# Patient Record
Sex: Female | Born: 1985 | Race: Black or African American | Hispanic: No | Marital: Single | Smoking: Current every day smoker
Health system: Southern US, Community
[De-identification: ages and names within clinical notes are randomized; demographics above are authoritative.]

---

## 2003-06-13 HISTORY — PX: DILATION AND CURETTAGE OF UTERUS: SHX78

## 2013-09-06 ENCOUNTER — Encounter (HOSPITAL_COMMUNITY): Payer: Self-pay | Admitting: Emergency Medicine

## 2013-09-06 ENCOUNTER — Emergency Department (HOSPITAL_COMMUNITY)
Admission: EM | Admit: 2013-09-06 | Discharge: 2013-09-06 | Disposition: A | Discharge status: Home / Self Care | Payer: Medicaid Other | Attending: Emergency Medicine | Admitting: Emergency Medicine

## 2013-09-06 DIAGNOSIS — Z8619 Personal history of other infectious and parasitic diseases: Secondary | ICD-10-CM | POA: Insufficient documentation

## 2013-09-06 DIAGNOSIS — R599 Enlarged lymph nodes, unspecified: Secondary | ICD-10-CM | POA: Insufficient documentation

## 2013-09-06 DIAGNOSIS — R220 Localized swelling, mass and lump, head: Secondary | ICD-10-CM | POA: Insufficient documentation

## 2013-09-06 DIAGNOSIS — L089 Local infection of the skin and subcutaneous tissue, unspecified: Secondary | ICD-10-CM | POA: Insufficient documentation

## 2013-09-06 DIAGNOSIS — R221 Localized swelling, mass and lump, neck: Secondary | ICD-10-CM

## 2013-09-06 MED ORDER — HYDROCODONE-ACETAMINOPHEN 5-325 MG PO TABS: 1.0000 | ORAL_TABLET | ORAL | Status: DC | PRN

## 2013-09-06 MED ORDER — VALACYCLOVIR HCL 1 G PO TABS: 1000.0000 mg | ORAL_TABLET | Freq: Three times a day (TID) | ORAL | Status: DC

## 2013-09-06 MED ORDER — FLUORESCEIN SODIUM 1 MG OP STRP
1.0000 | ORAL_STRIP | Freq: Once | OPHTHALMIC | Status: AC
  Administered 2013-09-06: 14:00:00 via OPHTHALMIC
  Filled 2013-09-06: qty 1

## 2013-09-06 MED ORDER — TETRACAINE HCL 0.5 % OP SOLN
1.0000 [drp] | Freq: Once | OPHTHALMIC | Status: AC
  Administered 2013-09-06: 1 [drp] via OPHTHALMIC
  Filled 2013-09-06: qty 2

## 2013-09-06 MED ORDER — ACYCLOVIR 400 MG PO TABS: 800.0000 mg | ORAL_TABLET | Freq: Every day | ORAL | Status: DC

## 2013-09-06 MED ORDER — SULFAMETHOXAZOLE-TRIMETHOPRIM 800-160 MG PO TABS: 1.0000 | ORAL_TABLET | Freq: Two times a day (BID) | ORAL | Status: DC

## 2013-09-06 NOTE — Discharge Instructions (Signed)
Impetigo Impetigo is an infection of the skin, most common in babies and children.  CAUSES  It is caused by staphylococcal or streptococcal germs (bacteria). Impetigo can start after any damage to the skin. The damage to the skin may be from things like:   Chickenpox.  Scrapes.  Scratches.  Insect bites (common when children scratch the bite).  Cuts.  Nail biting or chewing. Impetigo is contagious. It can be spread from one person to another. Avoid close skin contact, or sharing towels or clothing. SYMPTOMS  Impetigo usually starts out as small blisters or pustules. Then they turn into tiny yellow-crusted sores (lesions).  There may also be:  Large blisters.  Itching or pain.  Pus.  Swollen lymph glands. With scratching, irritation, or non-treatment, these small areas may get larger. Scratching can cause the germs to get under the fingernails; then scratching another part of the skin can cause the infection to be spread there. DIAGNOSIS  Diagnosis of impetigo is usually made by a physical exam. A skin culture (test to grow bacteria) may be done to prove the diagnosis or to help decide the best treatment.  TREATMENT  Mild impetigo can be treated with prescription antibiotic cream. Oral antibiotic medicine may be used in more severe cases. Medicines for itching may be used. HOME CARE INSTRUCTIONS   To avoid spreading impetigo to other body areas:  Keep fingernails short and clean.  Avoid scratching.  Cover infected areas if necessary to keep from scratching.  Gently wash the infected areas with antibiotic soap and water.  Soak crusted areas in warm soapy water using antibiotic soap.  Gently rub the areas to remove crusts. Do not scrub.  Wash hands often to avoid spread this infection.  Keep children with impetigo home from school or daycare until they have used an antibiotic cream for 48 hours (2 days) or oral antibiotic medicine for 24 hours (1 day), and their skin  shows significant improvement.  Children may attend school or daycare if they only have a few sores and if the sores can be covered by a bandage or clothing. SEEK MEDICAL CARE IF:   More blisters or sores show up despite treatment.  Other family members get sores.  Rash is not improving after 48 hours (2 days) of treatment. SEEK IMMEDIATE MEDICAL CARE IF:   You see spreading redness or swelling of the skin around the sores.  You see red streaks coming from the sores.  Your child develops a fever of 100.4 F (37.2 C) or higher.  Your child develops a sore throat.  Your child is acting ill (lethargic, sick to their stomach). Document Released: 05/26/2000 Document Revised: 08/21/2011 Document Reviewed: 03/25/2008 [facility] Patient Information 2014 [place], [place].  Shingles Shingles (herpes zoster) is an infection that is caused by the same virus that causes chickenpox (varicella). The infection causes a painful skin rash and fluid-filled blisters, which eventually break open, crust over, and heal. It may occur in any area of the body, but it usually affects only one side of the body or face. The pain of shingles usually lasts about 1 month. However, some people with shingles may develop long-term (chronic) pain in the affected area of the body. Shingles often occurs many years after the person had chickenpox. It is more common:  In people older than 50 years.  In people with weakened immune systems, such as those with HIV, AIDS, or cancer.  In people taking medicines that weaken the immune system, such as transplant  medicines.  In people under great stress. CAUSES  Shingles is caused by the varicella zoster virus (VZV), which also causes chickenpox. After a person is infected with the virus, it can remain in the person's body for years in an inactive state (dormant). To cause shingles, the virus reactivates and breaks out as an infection in a nerve root. The virus can be spread  from person to person (contagious) through contact with open blisters of the shingles rash. It will only spread to people who have not had chickenpox. When these people are exposed to the virus, they may develop chickenpox. They will not develop shingles. Once the blisters scab over, the person is no longer contagious and cannot spread the virus to others. SYMPTOMS  Shingles shows up in stages. The initial symptoms may be pain, itching, and tingling in an area of the skin. This pain is usually described as burning, stabbing, or throbbing.In a few days or weeks, a painful red rash will appear in the area where the pain, itching, and tingling were felt. The rash is usually on one side of the body in a band or belt-like pattern. Then, the rash usually turns into fluid-filled blisters. They will scab over and dry up in approximately 2 3 weeks. Flu-like symptoms may also occur with the initial symptoms, the rash, or the blisters. These may include:  Fever.  Chills.  Headache.  Upset stomach. DIAGNOSIS  Your caregiver will perform a skin exam to diagnose shingles. Skin scrapings or fluid samples may also be taken from the blisters. This sample will be examined under a microscope or sent to a lab for further testing. TREATMENT  There is no specific cure for shingles. Your caregiver will likely prescribe medicines to help you manage the pain, recover faster, and avoid long-term problems. This may include antiviral drugs, anti-inflammatory drugs, and pain medicines. HOME CARE INSTRUCTIONS   Take a cool bath or apply cool compresses to the area of the rash or blisters as directed. This may help with the pain and itching.   Only take over-the-counter or prescription medicines as directed by your caregiver.   Rest as directed by your caregiver.  Keep your rash and blisters clean with mild soap and cool water or as directed by your caregiver.  Do not pick your blisters or scratch your rash. Apply an  anti-itch cream or numbing creams to the affected area as directed by your caregiver.  Keep your shingles rash covered with a loose bandage (dressing).  Avoid skin contact with:  Babies.   Pregnant women.   Children with eczema.   Elderly people with transplants.   People with chronic illnesses, such as leukemia or AIDS.   Wear loose-fitting clothing to help ease the pain of material rubbing against the rash.  Keep all follow-up appointments with your caregiver.If the area involved is on your face, you may receive a referral for follow-up to a specialist, such as an eye doctor (ophthalmologist) or an ear, nose, and throat (ENT) doctor. Keeping all follow-up appointments will help you avoid eye complications, chronic pain, or disability.  SEEK IMMEDIATE MEDICAL CARE IF:   You have facial pain, pain around the eye area, or loss of feeling on one side of your face.  You have ear pain or ringing in your ear.  You have loss of taste.  Your pain is not relieved with prescribed medicines.   Your redness or swelling spreads.   You have more pain and swelling.  Your condition  is worsening or has changed.   You have a feveror persistent symptoms for more than 2 3 days.  You have a fever and your symptoms suddenly get worse. MAKE SURE YOU:  Understand these instructions.  Will watch your condition.  Will get help right away if you are not doing well or get worse. Document Released: 05/29/2005 Document Revised: 02/21/2012 Document Reviewed: 01/11/2012 [facility] Patient Information 2014 [place], [place]. Acetaminophen; Hydrocodone tablets or capsules What is this medicine? ACETAMINOPHEN; HYDROCODONE (a set a MEE noe fen; hye droe KOE done) is a pain reliever. It is used to treat mild to moderate pain. This medicine may be used for other purposes; ask your health care provider or pharmacist if you have questions. COMMON BRAND NAME(S): Anexsia, Bancap HC , Ceta-Plus,  Co-Gesic, Comfortpak , Dolagesic, [employer], [address]/[employer] , [address] , Hydrogesic, Lorcet HD, Lorcet Plus, Lorcet, Sleepy Eye, Margesic H, Maxidone, Mc[place], Polygesic, Winner, Central, Vicodin ES, Vicodin HP, Vicodin, [place] Baseman What should I tell my health care provider before I take this medicine? They need to know if you have any of these conditions: -brain tumor -Crohn's disease, inflammatory bowel disease, or ulcerative colitis -drug abuse or addiction -head injury -heart or circulation problems -if you often drink alcohol -kidney disease or problems going to the bathroom -liver disease -lung disease, asthma, or breathing problems -an unusual or allergic reaction to acetaminophen, hydrocodone, other opioid analgesics, other medicines, foods, dyes, or preservatives -pregnant or trying to get pregnant -breast-feeding How should I use this medicine? Take this medicine by mouth. Swallow it with a full glass of water. Follow the directions on the prescription label. If the medicine upsets your stomach, take the medicine with food or milk. Do not take more than you are told to take. Talk to your pediatrician regarding the use of this medicine in children. This medicine is not approved for use in children. Overdosage: If you think you have taken too much of this medicine contact a poison control center or emergency room at once. NOTE: This medicine is only for you. Do not share this medicine with others. What if I miss a dose? If you miss a dose, take it as soon as you can. If it is almost time for your next dose, take only that dose. Do not take double or extra doses. What may interact with this medicine? -alcohol -antihistamines -isoniazid -medicines for depression, anxiety, or psychotic disturbances -medicines for sleep -muscle relaxants -naltrexone -narcotic medicines (opiates) for pain -phenobarbital -ritonavir -tramadol This list may not describe all possible interactions. Give  your health care provider a list of all the medicines, herbs, non-prescription drugs, or dietary supplements you use. Also tell them if you smoke, drink alcohol, or use illegal drugs. Some items may interact with your medicine. What should I watch for while using this medicine? Tell your doctor or health care professional if your pain does not go away, if it gets worse, or if you have new or a different type of pain. You may develop tolerance to the medicine. Tolerance means that you will need a higher dose of the medicine for pain relief. Tolerance is normal and is expected if you take the medicine for a long time. Do not suddenly stop taking your medicine because you may develop a severe reaction. Your body becomes used to the medicine. This does NOT mean you are addicted. Addiction is a behavior related to getting and using a drug for a non-medical reason. If you have pain, you have a  medical reason to take pain medicine. Your doctor will tell you how much medicine to take. If your doctor wants you to stop the medicine, the dose will be slowly lowered over time to avoid any side effects. You may get drowsy or dizzy when you first start taking the medicine or change doses. Do not drive, use machinery, or do anything that may be dangerous until you know how the medicine affects you. Stand or sit up slowly. There are different types of narcotic medicines (opiates) for pain. If you take more than one type at the same time, you may have more side effects. Give your health care provider a list of all medicines you use. Your doctor will tell you how much medicine to take. Do not take more medicine than directed. Call emergency for help if you have problems breathing. The medicine will cause constipation. Try to have a bowel movement at least every 2 to 3 days. If you do not have a bowel movement for 3 days, call your doctor or health care professional. Too much acetaminophen can be very dangerous. Do not take  Tylenol (acetaminophen) or medicines that contain acetaminophen with this medicine. Many non-prescription medicines contain acetaminophen. Always read the labels carefully. What side effects may I notice from receiving this medicine? Side effects that you should report to your doctor or health care professional as soon as possible: -allergic reactions like skin rash, itching or hives, swelling of the face, lips, or tongue -breathing problems -confusion -feeling faint or lightheaded, falls -stomach pain -yellowing of the eyes or skin Side effects that usually do not require medical attention (report to your doctor or health care professional if they continue or are bothersome): -nausea, vomiting -stomach upset This list may not describe all possible side effects. Call your doctor for medical advice about side effects. You may report side effects to FDA at 1-800-FDA-1088. Where should I keep my medicine? Keep out of the reach of children. This medicine can be abused. Keep your medicine in a safe place to protect it from theft. Do not share this medicine with anyone. Selling or giving away this medicine is dangerous and against the law. Store at room temperature between 15 and 30 degrees C (59 and 86 degrees F). Protect from light. Keep container tightly closed.  Throw away any unused medicine after the expiration date. Discard unused medicine and used packaging carefully. Pets and children can be harmed if they find used or lost packages. NOTE: This sheet is a summary. It may not cover all possible information. If you have questions about this medicine, talk to your doctor, pharmacist, or health care provider.  2014, Elsevier/Gold Standard. (2013-01-20 13:15:56)

## 2013-09-06 NOTE — ED Provider Notes (Signed)
Medical screening examination/treatment/procedure(s) were performed by non-physician practitioner and as supervising physician I was immediately available for consultation/collaboration.   EKG Interpretation None       Kealy Lewter, MD 09/06/13 1632 

## 2013-09-06 NOTE — ED Provider Notes (Signed)
CSN: [id number]     Arrival date & time 09/06/13  1251 History   No chief complaint on file.  HPI This chart was scribed for non-physician practitioner, Arthor CaptainAbigail Sarahbeth Cashin, PA-C working with Doug SouSam Jacubowitz, MD, by Andrew Auaven Small, ED Scribe. This patient was seen in room WTR8/WTR8 and the patient's care was started at 1:06 PM.  Jodi Marks is a 28 y.o. female who presents to the Emergency Department complaining of red itchy bump located left eyebrow on onset 1 day ago. pt reports that she woke up 1 day ago and she had the bump on her face. She reports taking motrin this morning and cleaning the bump with perioxide. She reports that the peroxide has caused the bump to pus and become crusty She reports that the left side of her face was sore and tender.. Pt denies having similar bump in the past. She denies change in vision or ey pain. Pt reports that she has had chicken pox in the past   No past medical history on file. No past surgical history on file. No family history on file. History  Substance Use Topics  . Smoking status: Not on file  . Smokeless tobacco: Not on file  . Alcohol Use: Not on file   OB History   No data available     Review of Systems  HENT: Positive for facial swelling.   Eyes: Negative for pain and visual disturbance.   Allergies  Review of patient's allergies indicates no known allergies.  Home Medications  No current outpatient prescriptions on file. There were no vitals taken for this visit. Physical Exam  Nursing note and vitals reviewed. Constitutional: She is oriented to person, place, and time. She appears well-developed and well-nourished. No distress.  HENT:  Head: Normocephalic and atraumatic.  Face- 4x2 cm area induration with central honey color crust.   Eyes: EOM are normal.  Slit lamp exam:      The left eye shows no corneal flare, no corneal ulcer and no fluorescein uptake.  No dendritic lesions  Neck: Neck supple.  Pre and post auricular  lymphadenopathy. No tonsillar adenopathy .  Cardiovascular: Normal rate.   Pulmonary/Chest: Effort normal. No respiratory distress.  Musculoskeletal: Normal range of motion.  Lymphadenopathy:    She has cervical adenopathy.  Neurological: She is alert and oriented to person, place, and time.  Skin: Skin is warm and dry.  Psychiatric: She has a normal mood and affect. Her behavior is normal.   ED Course  Procedures DIAGNOSTIC STUDIES: Oxygen Saturation is 100% on ra, normal by my interpretation.    COORDINATION OF CARE: 2:11 PM-Discussed treatment plan which includes ABX and antiviral medication with pt at bedside and pt agreed to plan.   Labs Review Labs Reviewed - No data to display Imaging Review No results found.   EKG Interpretation None      MDM   Final diagnoses:  Skin infection     With soft tissue infection of the face.  No dendritic lesions on fluorescein exam.  Concern for possible impetigo versus developing zoster infection of the face.  On discharging the patient with treatment for both including Bactrim, pain medication.  Patient was given a single prescription for both acyclovir and Valtrex and will fill which ever is cheaper for her.  I discussed return precautions followup with ophthalmology.  I personally performed the services described in this documentation, which was scribed in my presence. The recorded information has been reviewed and is accurate.  Arthor Captain, PA-C 09/06/13 1623

## 2013-09-06 NOTE — ED Notes (Signed)
Pt states she woke up yesterday with some redness and pain above her left eye - along left eyebrow line.  Pt had eyebrows waxed 2 weeks ago.  Today pt presents with redness and swelling along left eyebrow.  Pt states she put hydrogen peroxide on area yesterday and it came to a head.  There is a slightly indented section in the middle.  Pt states area has been draining clear drainage.  Pt denies N/V/D and fever.

## 2013-11-04 ENCOUNTER — Encounter (HOSPITAL_COMMUNITY): Payer: Self-pay | Admitting: Emergency Medicine

## 2013-11-04 ENCOUNTER — Emergency Department (HOSPITAL_COMMUNITY): Payer: Medicaid Other

## 2013-11-04 ENCOUNTER — Emergency Department (HOSPITAL_COMMUNITY)
Admission: EM | Admit: 2013-11-04 | Discharge: 2013-11-04 | Disposition: A | Discharge status: Home / Self Care | Payer: Medicaid Other | Attending: Emergency Medicine | Admitting: Emergency Medicine

## 2013-11-04 DIAGNOSIS — M25562 Pain in left knee: Secondary | ICD-10-CM

## 2013-11-04 DIAGNOSIS — Z79899 Other long term (current) drug therapy: Secondary | ICD-10-CM | POA: Insufficient documentation

## 2013-11-04 DIAGNOSIS — Y939 Activity, unspecified: Secondary | ICD-10-CM | POA: Insufficient documentation

## 2013-11-04 DIAGNOSIS — IMO0002 Reserved for concepts with insufficient information to code with codable children: Secondary | ICD-10-CM | POA: Insufficient documentation

## 2013-11-04 DIAGNOSIS — Y929 Unspecified place or not applicable: Secondary | ICD-10-CM | POA: Insufficient documentation

## 2013-11-04 DIAGNOSIS — T148XXA Other injury of unspecified body region, initial encounter: Secondary | ICD-10-CM

## 2013-11-04 DIAGNOSIS — Z3202 Encounter for pregnancy test, result negative: Secondary | ICD-10-CM | POA: Insufficient documentation

## 2013-11-04 DIAGNOSIS — F17200 Nicotine dependence, unspecified, uncomplicated: Secondary | ICD-10-CM | POA: Insufficient documentation

## 2013-11-04 DIAGNOSIS — W108XXA Fall (on) (from) other stairs and steps, initial encounter: Secondary | ICD-10-CM | POA: Insufficient documentation

## 2013-11-04 DIAGNOSIS — W19XXXA Unspecified fall, initial encounter: Secondary | ICD-10-CM

## 2013-11-04 LAB — POC URINE PREG, ED: PREG TEST UR: NEGATIVE

## 2013-11-04 MED ORDER — BACITRACIN ZINC 500 UNIT/GM EX OINT: 1.0000 "application " | TOPICAL_OINTMENT | Freq: Two times a day (BID) | CUTANEOUS | Status: DC

## 2013-11-04 MED ORDER — HYDROCODONE-ACETAMINOPHEN 5-325 MG PO TABS: 1.0000 | ORAL_TABLET | Freq: Four times a day (QID) | ORAL | Status: DC | PRN

## 2013-11-04 NOTE — Discharge Instructions (Signed)
Please call your doctor for a followup appointment within 24-48 hours. When you talk to your doctor please let them know that you were seen in the emergency department and have them acquire all of your records so that they can discuss the findings with you and formulate a treatment plan to fully care for your new and ongoing problems. Please call and set-up an appointment with orthopedics regarding fall and knee pain Please keep knee in brace and use crutches for comfort Please elevate and ice-while elevating keep pillows underneath the knee so that the knee is in a bent fashion-toes above her nose Please take medication as prescribed rash on pain medications his be no drinking alcohol, driving, operating any heavy machinery if there is extra please disposer proper manner. Please do not take any extra Tylenol with this medication for this can lead to Tylenol overdose and liver failure. Please avoid any physical strenuous activity Please continue monitor symptoms closely and if symptoms are to worsen or change (fever greater than 101, numbness, tingling, fall, injury, chest pain, shortness of breath, difficulty breathing, swelling, red streaks, hot to the touch, discharge, pus drainage, worsening changes to pain pattern, swelling to the legs, tightness to the leg) please report back to the ED immediately   Arthralgia Your caregiver has diagnosed you as suffering from an arthralgia. Arthralgia means there is pain in a joint. This can come from many reasons including:  Bruising the joint which causes soreness (inflammation) in the joint.  Wear and tear on the joints which occur as we grow older (osteoarthritis).  Overusing the joint.  Various forms of arthritis.  Infections of the joint. Regardless of the cause of pain in your joint, most of these different pains respond to anti-inflammatory drugs and rest. The exception to this is when a joint is infected, and these cases are treated with  antibiotics, if it is a bacterial infection. HOME CARE INSTRUCTIONS   Rest the injured area for as long as directed by your caregiver. Then slowly start using the joint as directed by your caregiver and as the pain allows. Crutches as directed may be useful if the ankles, knees or hips are involved. If the knee was splinted or casted, continue use and care as directed. If an stretchy or elastic wrapping bandage has been applied today, it should be removed and re-applied every 3 to 4 hours. It should not be applied tightly, but firmly enough to keep swelling down. Watch toes and feet for swelling, bluish discoloration, coldness, numbness or excessive pain. If any of these problems (symptoms) occur, remove the ace bandage and re-apply more loosely. If these symptoms persist, contact your caregiver or return to this location.  For the first 24 hours, keep the injured extremity elevated on pillows while lying down.  Apply ice for 15-20 minutes to the sore joint every couple hours while awake for the first half day. Then 03-04 times per day for the first 48 hours. Put the ice in a plastic bag and place a towel between the bag of ice and your skin.  Wear any splinting, casting, elastic bandage applications, or slings as instructed.  Only take over-the-counter or prescription medicines for pain, discomfort, or fever as directed by your caregiver. Do not use aspirin immediately after the injury unless instructed by your physician. Aspirin can cause increased bleeding and bruising of the tissues.  If you were given crutches, continue to use them as instructed and do not resume weight bearing on the sore  joint until instructed. Persistent pain and inability to use the sore joint as directed for more than 2 to 3 days are warning signs indicating that you should see a caregiver for a follow-up visit as soon as possible. Initially, a hairline fracture (break in bone) may not be evident on X-rays. Persistent pain  and swelling indicate that further evaluation, non-weight bearing or use of the joint (use of crutches or slings as instructed), or further X-rays are indicated. X-rays may sometimes not show a small fracture until a week or 10 days later. Make a follow-up appointment with your own caregiver or one to whom we have referred you. A radiologist (specialist in reading X-rays) may read your X-rays. Make sure you know how you are to obtain your X-ray results. Do not assume everything is normal if you do not hear from [place]. SEEK MEDICAL CARE IF: Bruising, swelling, or pain increases. SEEK IMMEDIATE MEDICAL CARE IF:   Your fingers or toes are numb or blue.  The pain is not responding to medications and continues to stay the same or get worse.  The pain in your joint becomes severe.  You develop a fever over 102 F (38.9 C).  It becomes impossible to move or use the joint. MAKE SURE YOU:   Understand these instructions.  Will watch your condition.  Will get help right away if you are not doing well or get worse. Document Released: 05/29/2005 Document Revised: 08/21/2011 Document Reviewed: 01/15/2008 [facility] Patient Information 2014 [place], [place].   Emergency Department Resource Guide 1) Find a Doctor and Pay Out of Pocket Although you won't have to find out who is covered by your insurance plan, it is a good idea to ask around and get recommendations. You will then need to call the office and see if the doctor you have chosen will accept you as a new patient and what types of options they offer for patients who are self-pay. Some doctors offer discounts or will set up payment plans for their patients who do not have insurance, but you will need to ask so you aren't surprised when you get to your appointment.  2) Contact Your Local Health Department Not all health departments have doctors that can see patients for sick visits, but many do, so it is worth a call to see if yours does. If you  don't know where your local health department is, you can check in your phone book. The CDC also has a tool to help you locate your state's health department, and many state websites also have listings of all of their local health departments.  3) Find a Walk-in Clinic If your illness is not likely to be very severe or complicated, you may want to try a walk in clinic. These are popping up all over the country in pharmacies, drugstores, and shopping centers. They're usually staffed by nurse practitioners or physician assistants that have been trained to treat common illnesses and complaints. They're usually fairly quick and inexpensive. However, if you have serious medical issues or chronic medical problems, these are probably not your best option.  No Primary Care Doctor: - Call Health Connect at  [phone] - they can help you locate a primary care doctor that  accepts your insurance, provides certain services, etc. - Physician Referral Service- [phone]  Chronic Pain Problems: Organization         Address  Phone   Notes  [facility]  [phone] Patients need to be referred  by their primary care doctor.   Medication Assistance: Organization         Address  Phone   Notes  [facility] Medication [facility] [address] [place] [place], [place] [address] [phone] --Must be a resident of [facility] -- Must have NO insurance coverage whatsoever (no Medicaid/ Medicare, etc.) -- The pt. MUST have a primary care doctor that directs their care regularly and follows them in the community   [facility]  [phone]   [employer]  [phone]    Agencies that provide inexpensive medical care: Organization         Address  Phone   Notes  [facility]  [phone]   [facility]    [phone]   [facility] [address] [place], [place] [address] [phone]   [facility] [address] [place] [place] [phone]   Planned Parenthood    [phone]   [facility]    [phone]   [facility]  [address], [place] Phone:  [phone], Fax:  [phone] Hours of Operation:  9 am - 6 pm, M-F.  Also accepts Medicaid/Medicare and self-pay.  [facility]  [address], [place] Phone: [phone], Fax: [phone]. Hours of Operation:  8:30 am - 5:30 pm, M-F.  Also accepts Medicaid and self-pay.  [facility] [place] [address] [place] Phone: [phone]   [facility] [address] Natasha Bence [place], [place] [phone], Ext. [phone] Mondays & Thursdays: 7-9 AM.  First 15 patients are seen on a first come, first serve basis.    Medicaid-accepting [facility] Providers:  Organization         Address  Phone   Notes  [facility] [address], [place] [phone] Also accepts self-pay patients.  [facility] [address] Laurell Josephs [place], [place]  [phone]   [facility] [address], [place] [phone]   [facility] [address] [place] [phone]   Renaye Rakers [address], [place]   [phone] Only accepts [place] Access [place] patients after they have their name applied to their card.   Self-Pay (no insurance) in [facility]:  Organization         Address  Phone   Notes  Sickle Cell Patients, [facility] [address] [place], [place] [phone]   [facility] [address] [place], [place] [phone]   [facility] [place]  [address], [place] [phone]   [facility]/Dr. Osei-Bonsu  [address], [place] or [address], [place] [phone] Phone  number for both [place] and [place] locations is the same.  [facility] [address], [place] [phone]   [facility] [address], [place] or [address] [phone]   [facility] [address], [place] [phone], phone; [phone], fax Sees patients 1st and 3rd Saturday of every month.  Must not qualify for public or private insurance (i.e. Medicaid, Medicare, [place] Health Choice, Veterans' Benefits)  Household income should be no more  than 200% of the poverty level The clinic cannot treat you if you are pregnant or think you are pregnant  Sexually transmitted diseases are not treated at the clinic.    Dental Care: Organization         Address  Phone  Notes  [facility] of [facility] [address] [place], [place] [phone] Accepts children up to age 45 who are enrolled in [place] or [place] Health Choice; pregnant women with a Medicaid card; and children who have applied for Medicaid or [place] Health Choice, but were declined, whose parents can pay a reduced fee at time of service.  [facility] Department of [facility]  [address], [place] [phone] Accepts children up to age 30 who are enrolled in [place] or [place] Health Choice; pregnant women with a Medicaid card; and children who have applied for Medicaid or  Health Choice, but were declined, whose parents can pay a reduced fee at time of service.  [facility]  [address] [place], [place] [phone] Patients are seen by appointment only. Walk-ins are not accepted. [facility] will see patients 37 years of age and older. Monday - Tuesday (8am-5pm) Most Wednesdays (8:30-5pm) $30 per visit, cash only  [facility]  [address], [facility] [phone] Patients are seen by appointment only.  Walk-ins are not accepted. [facility] will see patients 27 years of age and older. One Wednesday Evening (Monthly: Volunteer Based).  $30 per visit, cash only  [employer]  [phone] for adults; Children under age 65, call Graduate Pediatric Dentistry at [phone]. Children aged 69-14, please call [phone] to request a pediatric application.  Dental services are provided in all areas of dental care including fillings, crowns and bridges, complete and partial dentures, implants, gum treatment, root canals, and extractions. Preventive care is also provided. Treatment is provided to both adults and children. Patients are selected via a lottery and there is often a waiting list.   [facility] [address] [place]  [phone] [email or web address]   [facility] [address] [place], [place] [phone], Ext. [phone] Second and Fourth Thursday of each month, opens at 6:30 AM; Clinic ends at 9 AM.  Patients are seen on a first-come first-served basis, and a limited number are seen during each clinic.   [facility]  [address] Ether Griffins [place], [place] [phone]   Eligibility Requirements You must have lived in [place], [place], or [place] counties for at least the last three months.   You cannot be eligible for state or federal sponsored [place], including [employer], [place], or [employer].   You generally cannot be eligible for healthcare insurance through your employer.    How to apply: Eligibility screenings are held every Tuesday and Wednesday afternoon from 1:00 pm until 4:00 pm. You do not need an appointment for the interview!  [facility] [address] [place], [place] [phone]   [facility] Health Department  [phone]   [facility] Health Department  [phone]   [facility] Health Department  [phone]    Behavioral Health  Resources in the Community: Intensive Outpatient Programs Organization         Address  Phone  Notes  [facility] Services [address] [place], [place] [phone]     Health Outpatient [address], [place], [place] [phone]   [facility] [address], [place], [place]  [phone]   [facility] Mental Health [address],  [place], [place] [phone] or [phone]   Substance Abuse Resources Organization         Address  Phone  Notes  [facility] Drug [facility]  [phone]   [facility]  [phone]   [facility] [place]  [phone]   Floydene Flock  [phone]   Residential & Outpatient Substance Abuse Program  [phone]   Psychological Services Organization         Address  Phone  Notes  [facility] Behavioral Health  [phone]   [facility]  [phone]   [facility] Health [address] [place] [phone] or [phone]    Mobile Crisis Teams Organization         Address  Phone  Notes  [facility], Mobile Crisis Care Unit  [phone]   [facility]  [address] [place], [place] [phone]   Doristine Locks [address] [place] [phone]    Self-Help/Support Groups Organization         Address  Phone             Notes  [facility] [place] - variety of support groups  336- I7437963 Call for more information  Narcotics Anonymous (NA), [facility] [address], [employer] [place]  2 meetings at this location   Statistician         Address  Phone  Notes  [facility] [address],    [place]  [phone]   [facility]  [address], [place] [address], [place], [place] [phone]   [facility] [address] [place], [place] [phone] Admissions: 8am-3pm M-F  [facility] [address],    [place], [place] [phone]   [facility] [address] [place], [place], [place] [phone]   [facility] [address]  [place], [place] [phone]   [facility] - Intensive Outpatient [address] Laurell Josephs [address], [place], [place] [phone]   [facility] (Addiction Recovery [facility] [address] [place]  [place], [place] [phone] or [phone]   [facility] ([facility]) [address], [place], [place] [phone] Accepts Medicaid  [facility] [place] [address]  [place] [phone] Substance Abuse/Addiction Treatment   [facility] Organization         Address  Phone  Notes  [facility]  [phone]   Angie Fava, PhD [address] Ervin Knack [place], [place]   [phone] or [phone]   [facility] Behavioral   [address] [place], [place] [phone]   [facility] [address], [place], [place] [phone] Insurance/Medicaid/sponsorship through [facility] and Families [address]                                    [place], [place] [phone] Therapy/tele-psych/case  [facility] [address] [place], [place] [phone]    Dr. Lolly Mustache  [phone]   [facility] [place]  [employer] [facility] 1) [address], [place] 2) [address], [place] 3)  [address] [place]  [address], [place] [phone]  [phone]   [facility] Child Abuse Hotline (740)792-2570 or [phone] (After Hours)

## 2013-11-04 NOTE — ED Provider Notes (Signed)
CSN: [id number]     Arrival date & time 11/04/13  1551 History   This chart was scribed for non-physician practitioner Raymon Mutton, PA-C, working with [name]. Rubin Payor, MD, by Yevette Edwards, ED Scribe. This patient was seen in room WTR8/WTR8 and the patient's care was started at 5:57 PM.  First MD Initiated Contact with Patient 11/04/13 1659     Chief Complaint  Patient presents with  . Knee Injury    The history is provided by the patient. No language interpreter was used.   HPI Comments: Jodi Marks is a 28 y.o. female who presents to the Emergency Department complaining of a fall which occurred yesterday evening at approximately 10 pm when she fell down the stairs. The pt denies head impact or LOC. She is complaining of non-radiating pain to her left knee which she characterizes as "throbbing," and she also complains of two abrasions to her knee. She states weakness to the knee with weight-bearing, though she is able to ambulate. Ms. Shawhan denies numbness, paresthesia, or complete loss of sensation. She also denies prior injuries to the knee. The pt has not used any OTC medication for the pain. She has ace-wrapped and elevated the knee.  Her tetanus is up to date.   History reviewed. No pertinent past medical history. Past Surgical History  Procedure Laterality Date  . Dilation and curettage of uterus  2005  . Vaginal delivery      x2   No family history on file. History  Substance Use Topics  . Smoking status: Current Every Day Smoker  . Smokeless tobacco: Never Used  . Alcohol Use: No   No OB history provided.  Review of Systems  Musculoskeletal: Positive for arthralgias (left knee).  Skin: Positive for wound.  Neurological: Negative for weakness and numbness.    Allergies  Review of patient's allergies indicates no known allergies.  Home Medications   Prior to Admission medications   Medication Sig Start Date End Date Taking? Authorizing Provider  acyclovir  (ZOVIRAX) 400 MG tablet Take 2 tablets (800 mg total) by mouth 5 (five) times daily. 09/06/13   Arthor Captain, PA-C  HYDROcodone-acetaminophen (NORCO) 5-325 MG per tablet Take 1 tablet by mouth every 4 (four) hours as needed. 09/06/13   Arthor Captain, PA-C  ibuprofen (ADVIL,MOTRIN) 200 MG tablet Take 400 mg by mouth every 6 (six) hours as needed for headache.    Historical Provider, MD  medroxyPROGESTERone (DEPO-PROVERA) 150 MG/ML injection Inject into the muscle every 3 (three) months.    Historical Provider, MD  sulfamethoxazole-trimethoprim (SEPTRA DS) 800-160 MG per tablet Take 1 tablet by mouth every 12 (twelve) hours. 09/06/13   Arthor Captain, PA-C  valACYclovir (VALTREX) 1000 MG tablet Take 1 tablet (1,000 mg total) by mouth 3 (three) times daily. 09/06/13   Arthor Captain, PA-C   Triage Vitals: BP 107/70  Pulse 85  Temp(Src) 98 F (36.7 C) (Oral)  Resp 16  Wt 185 lb (83.915 kg)  SpO2 99%  Physical Exam  Nursing note and vitals reviewed. Constitutional: She is oriented to person, place, and time. She appears well-developed and well-nourished. No distress.  HENT:  Head: Normocephalic and atraumatic.  Mouth/Throat: Oropharynx is clear and moist. No oropharyngeal exudate.  Eyes: Conjunctivae and EOM are normal. Pupils are equal, round, and reactive to light. Right eye exhibits no discharge. Left eye exhibits no discharge.  Neck: Normal range of motion. Neck supple. No tracheal deviation present.  Cardiovascular: Normal rate, regular rhythm and normal heart  sounds.  Exam reveals no friction rub.   No murmur heard. Pulses:      Radial pulses are 2+ on the right side, and 2+ on the left side.       Dorsalis pedis pulses are 2+ on the right side, and 2+ on the left side.       Posterior tibial pulses are 2+ on the right side, and 2+ on the left side.  Pulmonary/Chest: Effort normal and breath sounds normal. No respiratory distress. She has no wheezes. She has no rales.  Musculoskeletal:  She exhibits tenderness.       Left knee: She exhibits no swelling, no effusion, no ecchymosis, no deformity, no laceration, no erythema and normal alignment. Tenderness found. Medial joint line, lateral joint line, MCL, LCL and patellar tendon tenderness noted.       Legs: Superficial abrasion noted to the anterior aspect of the left knee, patellar region - negative active drainage or bleeding noted. Pain upon palpation to the left knee circumferentially. Decreased flexion of the left knee secondary to pain. Negative anterior and posterior draw sign. Stable left knee joint.  Full ROM to the left ankle, foot/digits.   Lymphadenopathy:    She has no cervical adenopathy.  Neurological: She is alert and oriented to person, place, and time. No cranial nerve deficit. She exhibits normal muscle tone. Coordination normal.  Cranial nerves III-XII grossly intact Strength 5+/5+ to upper and lower extremities bilaterally with resistance applied, equal distribution noted Strength intact to the digits of the left foot Sensation intact with differentiation to sharp and dull touch   Skin: Skin is warm and dry.  Psychiatric: She has a normal mood and affect. Her behavior is normal.    ED Course  Procedures (including critical care time)  DIAGNOSTIC STUDIES: Oxygen Saturation is 99% on room air, normal by my interpretation.    COORDINATION OF CARE:  6:03 PM- Discussed treatment plan with patient, and the patient agreed to the plan. The plan includes imaging.   Labs Review Labs Reviewed  POC URINE PREG, ED    Imaging Review Dg Knee Complete 4 Views Left  11/04/2013   CLINICAL DATA:  Fall down stairs, left knee pain/abrasion  EXAM: LEFT KNEE - COMPLETE 4+ VIEW  COMPARISON:  None.  FINDINGS: No fracture or dislocation is seen.  The joint spaces are preserved.  The visualized soft tissues are unremarkable. No radiopaque foreign body is seen.  No definite suprapatellar knee joint effusion.  IMPRESSION: No  fracture, dislocation, or radiopaque foreign body is seen.   Electronically Signed   By: Charline BillsSriyesh  Krishnan M.D.   On: 11/04/2013 17:29     EKG Interpretation None      MDM   Final diagnoses:  Left knee pain  Fall  Superficial abrasion    Filed Vitals:   11/04/13 1608  BP: 107/70  Pulse: 85  Temp: 98 F (36.7 C)  TempSrc: Oral  Resp: 16  Weight: 185 lb (83.915 kg)  SpO2: 99%   I personally performed the services described in this documentation, which was scribed in my presence. The recorded information has been reviewed and is accurate.  Urine pregnancy negative. Plain film of left knee negative for fracture, dislocation or radiopaque foreign bodies. Negative focal neurological deficits noted. Pulses palpable and strong. Patient is neurovascularly intact. Gait proper with-negative step-offs or sway. Flexion is present with passive motion-mild discomfort noted. Negative dislocation deformities identified to the left knee.  Doubt ischemia. Doubt compartment syndrome. Doubt  septic joint. Suspicion to be knee sprain secondary to injury. Patient stable, afebrile. Discharged patient. Patient's wounds cleaned and bandaged. Patient placed in the sleeve of crutches for comfort. Discussed with patient to rest, ice, elevate. Discussed with patient to avoid any physical or strenuous activity. Referred patient to help him on a Center and orthopedics. Discussed with patient to closely monitor symptoms and if symptoms are to worsen or change to report back to the ED - strict return instructions given.  Patient agreed to plan of care, understood, all questions answered.   Raymon Mutton, PA-C 11/04/13 2136

## 2013-11-04 NOTE — ED Notes (Signed)
Pt alert, c/o rigth knee pain, onset was last PM, states trip fall injury, + abrasion, ambulates to triage, last tet <5 yrs

## 2013-11-04 NOTE — ED Notes (Addendum)
Pt has abrasion to L knee. Pt ambulatory to room

## 2013-11-04 NOTE — ED Notes (Signed)
Cleaned pt's abrasion and applied bacitracin ointment

## 2013-11-05 NOTE — ED Provider Notes (Signed)
Medical screening examination/treatment/procedure(s) were performed by non-physician practitioner and as supervising physician I was immediately available for consultation/collaboration.   EKG Interpretation None       Torrion Witter R. Artha Stavros, MD 11/05/13 0002 

## 2014-02-22 ENCOUNTER — Emergency Department (HOSPITAL_COMMUNITY)
Admission: EM | Admit: 2014-02-22 | Discharge: 2014-02-22 | Disposition: A | Discharge status: Home / Self Care | Payer: Medicaid Other

## 2014-02-22 NOTE — ED Notes (Signed)
Pt called for triage 3 times, no response

## 2014-11-15 ENCOUNTER — Encounter (HOSPITAL_COMMUNITY): Payer: Self-pay | Admitting: Emergency Medicine

## 2014-11-15 ENCOUNTER — Emergency Department (HOSPITAL_COMMUNITY)
Admission: EM | Admit: 2014-11-15 | Discharge: 2014-11-15 | Disposition: A | Discharge status: Home / Self Care | Payer: Medicaid Other | Attending: Emergency Medicine | Admitting: Emergency Medicine

## 2014-11-15 ENCOUNTER — Emergency Department (HOSPITAL_COMMUNITY): Payer: Medicaid Other

## 2014-11-15 DIAGNOSIS — R51 Headache: Secondary | ICD-10-CM | POA: Diagnosis not present

## 2014-11-15 DIAGNOSIS — F419 Anxiety disorder, unspecified: Secondary | ICD-10-CM | POA: Diagnosis present

## 2014-11-15 DIAGNOSIS — R519 Headache, unspecified: Secondary | ICD-10-CM

## 2014-11-15 DIAGNOSIS — Z3202 Encounter for pregnancy test, result negative: Secondary | ICD-10-CM | POA: Diagnosis not present

## 2014-11-15 DIAGNOSIS — Z720 Tobacco use: Secondary | ICD-10-CM | POA: Insufficient documentation

## 2014-11-15 LAB — COMPREHENSIVE METABOLIC PANEL
ALT: 14 U/L (ref 14–54)
ANION GAP: 10 (ref 5–15)
AST: 18 U/L (ref 15–41)
Albumin: 4.4 g/dL (ref 3.5–5.0)
Alkaline Phosphatase: 91 U/L (ref 38–126)
BILIRUBIN TOTAL: 0.6 mg/dL (ref 0.3–1.2)
BUN: 8 mg/dL (ref 6–20)
CO2: 23 mmol/L (ref 22–32)
Calcium: 9.4 mg/dL (ref 8.9–10.3)
Chloride: 107 mmol/L (ref 101–111)
Creatinine, Ser: 0.81 mg/dL (ref 0.44–1.00)
GFR calc Af Amer: >60 mL/min (ref >60)
GFR calc non Af Amer: >60 mL/min (ref >60)
GLUCOSE: 94 mg/dL (ref 65–99)
POTASSIUM: 3.6 mmol/L (ref 3.5–5.1)
Sodium: 140 mmol/L (ref 135–145)
Total Protein: 8.2 g/dL — ABNORMAL HIGH (ref 6.5–8.1)

## 2014-11-15 LAB — CBC
HCT: 37.2 % (ref 36.0–46.0)
HEMOGLOBIN: 12.7 g/dL (ref 12.0–15.0)
MCH: 30.6 pg (ref 26.0–34.0)
MCHC: 34.1 g/dL (ref 30.0–36.0)
MCV: 89.6 fL (ref 78.0–100.0)
Platelets: 304 10*3/uL (ref 150–400)
RBC: 4.15 MIL/uL (ref 3.87–5.11)
RDW: 12.3 % (ref 11.5–15.5)
WBC: 7.2 10*3/uL (ref 4.0–10.5)

## 2014-11-15 LAB — POC URINE PREG, ED: Preg Test, Ur: NEGATIVE

## 2014-11-15 MED ORDER — HYDROXYZINE HCL 10 MG PO TABS: 10.0000 mg | ORAL_TABLET | Freq: Four times a day (QID) | ORAL | Status: DC | PRN

## 2014-11-15 NOTE — Discharge Instructions (Signed)
Generalized Anxiety Disorder Generalized anxiety disorder (GAD) is a mental disorder. It interferes with life functions, including relationships, work, and school. GAD is different from normal anxiety, which everyone experiences at some point in their lives in response to specific life events and activities. Normal anxiety actually helps [place] prepare for and get through these life events and activities. Normal anxiety goes away after the event or activity is over.  GAD causes anxiety that is not necessarily related to specific events or activities. It also causes excess anxiety in proportion to specific events or activities. The anxiety associated with GAD is also difficult to control. GAD can vary from mild to severe. People with severe GAD can have intense waves of anxiety with physical symptoms (panic attacks).  SYMPTOMS The anxiety and worry associated with GAD are difficult to control. This anxiety and worry are related to many life events and activities and also occur more days than not for 6 months or longer. People with GAD also have three or more of the following symptoms (one or more in children):  Restlessness.   Fatigue.  Difficulty concentrating.   Irritability.  Muscle tension.  Difficulty sleeping or unsatisfying sleep. DIAGNOSIS GAD is diagnosed through an assessment by your health care provider. Your health care provider will ask you questions aboutyour mood,physical symptoms, and events in your life. Your health care provider may ask you about your medical history and use of alcohol or drugs, including prescription medicines. Your health care provider may also do a physical exam and blood tests. Certain medical conditions and the use of certain substances can cause symptoms similar to those associated with GAD. Your health care provider may refer you to a mental health specialist for further evaluation. TREATMENT The following therapies are usually used to treat GAD:    Medication. Antidepressant medication usually is prescribed for long-term daily control. Antianxiety medicines may be added in severe cases, especially when panic attacks occur.   Talk therapy (psychotherapy). Certain types of talk therapy can be helpful in treating GAD by providing support, education, and guidance. A form of talk therapy called cognitive behavioral therapy can teach you healthy ways to think about and react to daily life events and activities.  Stress managementtechniques. These include yoga, meditation, and exercise and can be very helpful when they are practiced regularly. A mental health specialist can help determine which treatment is best for you. Some people see improvement with one therapy. However, other people require a combination of therapies. Document Released: 09/23/2012 Document Revised: 10/13/2013 Document Reviewed: 09/23/2012 [facility] Patient Information 2015 [place], [place]. This information is not intended to replace advice given to you by your health care provider. Make sure you discuss any questions you have with your health care provider. General Headache Without Cause A headache is pain or discomfort felt around the head or neck area. The specific cause of a headache may not be found. There are many causes and types of headaches. A few common ones are:  Tension headaches.  Migraine headaches.  Cluster headaches.  Chronic daily headaches. HOME CARE INSTRUCTIONS   Keep all follow-up appointments with your caregiver or any specialist referral.  Only take over-the-counter or prescription medicines for pain or discomfort as directed by your caregiver.  Lie down in a dark, quiet room when you have a headache.  Keep a headache journal to find out what may trigger your migraine headaches. For example, write down:  What you eat and drink.  How much sleep you get.  Any  change to your diet or medicines.  Try massage or other relaxation  techniques.  Put ice packs or heat on the head and neck. Use these 3 to 4 times per day for 15 to 20 minutes each time, or as needed.  Limit stress.  Sit up straight, and do not tense your muscles.  Quit smoking if you smoke.  Limit alcohol use.  Decrease the amount of caffeine you drink, or stop drinking caffeine.  Eat and sleep on a regular schedule.  Get 7 to 9 hours of sleep, or as recommended by your caregiver.  Keep lights dim if bright lights bother you and make your headaches worse. SEEK MEDICAL CARE IF:   You have problems with the medicines you were prescribed.  Your medicines are not working.  You have a change from the usual headache.  You have nausea or vomiting. SEEK IMMEDIATE MEDICAL CARE IF:   Your headache becomes severe.  You have a fever.  You have a stiff neck.  You have loss of vision.  You have muscular weakness or loss of muscle control.  You start losing your balance or have trouble walking.  You feel faint or pass out.  You have severe symptoms that are different from your first symptoms. MAKE SURE YOU:   Understand these instructions.  Will watch your condition.  Will get help right away if you are not doing well or get worse. Document Released: 05/29/2005 Document Revised: 08/21/2011 Document Reviewed: 06/14/2011 [facility] Patient Information 2015 [place], [place]. This information is not intended to replace advice given to you by your health care provider. Make sure you discuss any questions you have with your health care provider. Substance Abuse Treatment Programs  Intensive Outpatient Programs [facility]     [address]      [place], [place]                   [phone]       [facility] [address] [place], [place] [phone]  [facility]     (Inpatient and outpatient)     [address]           [phone]    [facility] [phone] (Suboxone and Methadone)  [address]      [place], [place] [address]      [phone]       [address] [place], [place] [phone]  [facility] (Outpatient/Inpatient, [facility])    (insurance only) [phone]             [facility] (Groups & Residential) [place], [place] [phone]     [facility]     [address]     [place], [place]      [phone]       [facility] (for caregivers and family) [phone] [address] [place], [place] [phone]      Residential Treatment Programs [facility]      [address], [place], [place] [address]  [phone]       [facility] [address] [place], [place] [address] [phone]  [facility] [place]        [phone]       [facility] [phone]  [facility] ([facility]             [address]  [place], [place]                                                [phone] or [phone]                               [facility] [address] [place], [address] [phone]  [facility]    [address]      [place], [place]     [phone]       [facility] [address] [place], [place] [phone]  [facility]   [address] [place], [place] [address]     [phone]      Admissions: 8am-3pm M-F  [facility] ([facility]) [address] [place], [place] [phone]  [facility]: Residential Program [phone] Days)   [place], [place]      [phone] or [phone]     [facility]: [facility] [place], [place] (Walk in Hours over the weekend or by referral)  [facility] [address] [place], [place], [place] [address] [phone]  Crisis Mobile: [facility]:  [phone] (for crisis response 24 hours a  day) [facility] Hotline:      [phone] Outpatient Psychiatry and Counseling  [facility]: Mobile Crisis Management 24 hours:  [phone]  [facility] of the [employer] sliding scale fee and walk in schedule: M-F 8am-12pm/1pm-3pm [address]  [place], [place] [address] [phone]  [facility] [address] [place], [place] [address] [phone]  [facility] ([facility] [employer])- new patient walk-in appointments available Monday - Friday 8am -3pm.          [address] [place], [place] [address] [phone] or crisis line- [phone]  [facility] Outpatient Services/ Intensive Outpatient Therapy Program [address] [place], [place] [address] [phone]  [facility]                  Crisis Services      [phone] [address]     [place], [place] [address]                 [facility]   [facility] [phone]. [address] [place], [place] [address]   [facility] of Care          [address]  [place], [place] [address]       [phone]  [facility] [address] [place], [place] [address] [phone]  [facility]    [address]    [place], [place] [address]     [phone]       Andee Poles, MD     [address]     [place] [address]     [phone]       [facility] [address] [place] [address]  [facility]     [address]     [place], [place]      [phone]       [facility] Eulogio Ditch, [place] [address] [facility] [phone]  [place], [place] [address] [phone]  [facility]     [address]     [place], [place] [address]     [phone]       [facility] [address] [place], [place]  [id number] [phone] Resources for Temporary Residential Assistance/Crisis Centers  [facility] CENT[facility]) M-F 8am-3pm   [address] [place], [place] [address]   [phone] Services include: laundry, barbering, support groups, case management, phone  & computer access, showers, AA/NA mtgs, mental health/substance abuse nurse, job skills class, disability information, VA assistance, spiritual classes, etc.   HOMELESS SHELTERS  [facility]     [facility]   [address], [place]     [phone]              [facility] (women and children)       [address] [place], [place] [address] [phone] [email or web address] for application and process Application Required  [facility]   [address]    [place] [address]     [phone]                    [facility] [address] [place], [place] [address] [phone](schedule application appt.) Application Required  [facility] (women only)    [address]     [place], [place] [address]     [phone]      Intake starts 6pm daily Need valid ID, SSC, & Police report [facility] [place] [address] [place], [place] [phone] Application Required  [facility] (men only)     [address]      [place], [place]     [phone]       [facility] (Pregnant women only) [address] [place], [place] [phone]  [facility]      [address]      [place], [place] [address]     [phone]             [facility] [address] [place], [place] [phone] 90 day commitment/SA/Application process  [facility](men only)     [address]     [place], [place]     [phone]       Check-in at [facility]            [facility] [address] [place], [place]  [address] [phone] Men/Women/Women and Children must be there by 7 pm  [facility] [place], [place] [phone]

## 2014-11-15 NOTE — ED Notes (Signed)
Pt c/o headache, SOB, and dizziness since this morning. Pt tearful in triage. Pt A&Ox4 and ambulatory. NAD noted. Pt's breath sounds clear in all lung fields. Pt speaking in complete sentences.

## 2014-11-15 NOTE — ED Provider Notes (Signed)
CSN: [id number]     Arrival date & time 11/15/14  1431 History   First MD Initiated Contact with Patient 11/15/14 1744     Chief Complaint  Patient presents with  . Dizziness  . Shortness of Breath   Jodi Marks is a 29 y.o. female who is otherwise healthy who presents to the ED complaining of a panic attack earlier today. Patient reports she got in an argument with a friend when she started to hyperventilate and felt lightheaded, short of breath and developed a headache. She reports she was feeling very anxious and was breathing very fast. She reports she had floaters in her vision during this time. She reports that currently all her symptoms have resolved except for a 8/10 frontal headache. Patient reports that now her shortness of breath, lightheadedness and vision changes have completely resolved. She does report she still feels slightly anxious. Patient reports being under lots of stress lately and has been very stressed out. Patient denies fevers, chills, cough, hemoptysis, wheezing, urinary symptoms, abdominal pain, nausea, vomiting, suicidal ideations, homicidal ideations, or recent surgeries. The patient denies personal or family history of DVTs or PEs. The patient denies personal or close family history of any blood clotting disorders such as factor V Leiden, protein C or S deficiency. Patient denies any recent long travel, surgeries or endogenous estrogen use.   (Consider location/radiation/quality/duration/timing/severity/associated sxs/prior Treatment) HPI  History reviewed. No pertinent past medical history. Past Surgical History  Procedure Laterality Date  . Dilation and curettage of uterus  2005  . Vaginal delivery      x2   No family history on file. History  Substance Use Topics  . Smoking status: Current Every Day Smoker  . Smokeless tobacco: Never Used  . Alcohol Use: No   OB History    No data available     Review of Systems  Constitutional: Negative for fever  and chills.  HENT: Negative for congestion, ear pain and sore throat.   Eyes: Negative for pain and visual disturbance.  Respiratory: Positive for shortness of breath. Negative for cough and wheezing.   Cardiovascular: Negative for chest pain, palpitations and leg swelling.  Gastrointestinal: Negative for nausea, vomiting, abdominal pain and diarrhea.  Genitourinary: Negative for dysuria, frequency, hematuria and difficulty urinating.  Musculoskeletal: Negative for back pain and neck pain.  Skin: Negative for rash.  Neurological: Positive for light-headedness and headaches. Negative for syncope, weakness and numbness.  Psychiatric/Behavioral: Negative for suicidal ideas and dysphoric mood. The patient is nervous/anxious.       Allergies  Review of patient's allergies indicates no known allergies.  Home Medications   Prior to Admission medications   Medication Sig Start Date End Date Taking? Authorizing Provider  HYDROcodone-acetaminophen (NORCO/VICODIN) 5-325 MG per tablet Take 1 tablet by mouth every 6 (six) hours as needed for moderate pain. Patient not taking: Reported on 11/15/2014 11/04/13   Marissa Sciacca, PA-C  hydrOXYzine (ATARAX/VISTARIL) 10 MG tablet Take 1 tablet (10 mg total) by mouth every 6 (six) hours as needed for anxiety. 11/15/14   Everlene FarrierWilliam Maylee Bare, PA-C  medroxyPROGESTERone (DEPO-PROVERA) 150 MG/ML injection Inject into the muscle every 3 (three) months.    Historical Provider, MD   BP 123/73 mmHg  Pulse 62  Temp(Src) 98.7 F (37.1 C) (Oral)  Resp 20  SpO2 99% Physical Exam  Constitutional: She is oriented to person, place, and time. She appears well-developed and well-nourished. No distress.  Nontoxic appearing.  HENT:  Head: Normocephalic and atraumatic.  Mouth/Throat:  Oropharynx is clear and moist. No oropharyngeal exudate.  Eyes: Conjunctivae and EOM are normal. Pupils are equal, round, and reactive to light. Right eye exhibits no discharge. Left eye exhibits  no discharge.  Neck: Normal range of motion. Neck supple. No JVD present. No tracheal deviation present.  Cardiovascular: Normal rate, regular rhythm, normal heart sounds and intact distal pulses.  Exam reveals no gallop and no friction rub.   No murmur heard. Bilateral radial, posterior tibialis and dorsalis pedis pulses are intact.    Pulmonary/Chest: Effort normal and breath sounds normal. No respiratory distress. She has no wheezes. She has no rales.  Lungs are clear to auscultation bilaterally.  Abdominal: Soft. Bowel sounds are normal. She exhibits no distension. There is no tenderness.  Musculoskeletal: She exhibits no edema or tenderness.  No other extremity edema or tenderness. Strength is 5 out of 5 in her bilateral upper and lower extremities.  Lymphadenopathy:    She has no cervical adenopathy.  Neurological: She is alert and oriented to person, place, and time. No cranial nerve deficit. Coordination normal.  Cranial nerves intact bilaterally. Patient is alert and oriented 3. No pronator drift. Coordination is intact bilaterally. EOMs intact bilaterally. Sensation is intact her bilateral upper and lower extremities.  Skin: Skin is warm and dry. No rash noted. She is not diaphoretic. No erythema. No pallor.  Psychiatric: Her speech is normal and behavior is normal. Her mood appears anxious. Her affect is not blunt and not labile. She expresses no homicidal and no suicidal ideation.  Patient is slightly tearful during interview. Patient appears slightly anxious. She denies suicidal or homicidal ideations.  Nursing note and vitals reviewed.   ED Course  Procedures (including critical care time) Labs Review Labs Reviewed  COMPREHENSIVE METABOLIC PANEL - Abnormal; Notable for the following:    Total Protein 8.2 (*)    All other components within normal limits  CBC  POC URINE PREG, ED    Imaging Review Dg Chest 2 View  11/15/2014   CLINICAL DATA:  Dizziness and shortness of  breath. Left upper quadrant chest pain.  EXAM: CHEST - 2 VIEW  COMPARISON:  None.  FINDINGS: The heart size and mediastinal contours are within normal limits. Both lungs are clear. The visualized skeletal structures are unremarkable.  IMPRESSION: Negative two view chest.   Electronically Signed   By: Marin Roberts M.D.   On: 11/15/2014 15:45     EKG Interpretation   Date/Time:  Sunday November 15 2014 15:21:45 EDT Ventricular Rate:  97 PR Interval:  155 QRS Duration: 73 QT Interval:  310 QTC Calculation: 394 R Axis:   50 Text Interpretation:  Sinus rhythm No old tracing to compare Confirmed by  CAMPOS  MD, KEVIN ([id number]) on 11/16/2014 12:57:25 AM      Filed Vitals:   11/15/14 1508 11/15/14 1748 11/15/14 1953  BP: 110/70 113/64 123/73  Pulse: 100 79 62  Temp: 98.6 F (37 C) 98.7 F (37.1 C) 98.7 F (37.1 C)  TempSrc: Oral Oral Oral  Resp: SpO2: 98% 100% 99%     MDM   Meds given in ED:  Medications - No data to display  Discharge Medication List as of 11/15/2014  8:02 PM    START taking these medications   Details  hydrOXYzine (ATARAX/VISTARIL) 10 MG tablet Take 1 tablet (10 mg total) by mouth every 6 (six) hours as needed for anxiety., Starting 11/15/2014, Until Discontinued, Print  Final diagnoses:  Anxiety  Bad headache   This is a 29 y.o. female who is otherwise healthy who presents to the ED complaining of a panic attack earlier today. Patient reports she got in an argument with a friend when she started to hyperventilate and felt lightheaded, short of breath and developed a headache. She reports she was feeling very anxious and was breathing very fast. She reports she had floaters in her vision during this time. She reports that currently all her symptoms have resolved except for a 8/10 frontal headache. On exam the patient is afebrile and nontoxic appearing. She is not tachypneic, tachycardic or hypoxic. She denies current shortness of breath.  She  appears very anxious during the interview and endorses thoughts of anxiety and stress lately. She believes her symptoms could be related to her anxiety. Her lungs were clear auscultation bilaterally. CMP, CBC and chest x-ray are unremarkable. Will check urine pregnancy test and help with her headache. After the urine drug screen returned negative the patient reports her headache has come is completely resolved and she feels ready to be discharged.  I discussed ways to help her cope with her anxiety. Will discharge with prescription for Vistaril for anxiety. I encouraged behavioral health follow up and provided resources. I advised the patient to follow-up with their primary care provider this week. I advised the patient to return to the emergency department with new or worsening symptoms or new concerns. The patient verbalized understanding and agreement with plan.    This patient was discussed with Dr. Judd Lien who agrees with assessment and plan.    Everlene Farrier, PA-C 11/16/14 1610  Geoffery Lyons, MD 11/18/14 0630

## 2015-12-24 ENCOUNTER — Encounter (HOSPITAL_COMMUNITY): Payer: Self-pay

## 2015-12-24 ENCOUNTER — Emergency Department (HOSPITAL_COMMUNITY)
Admission: EM | Admit: 2015-12-24 | Discharge: 2015-12-24 | Disposition: A | Discharge status: Home / Self Care | Payer: Medicaid Other | Attending: Emergency Medicine | Admitting: Emergency Medicine

## 2015-12-24 DIAGNOSIS — R51 Headache: Secondary | ICD-10-CM | POA: Diagnosis present

## 2015-12-24 DIAGNOSIS — L0201 Cutaneous abscess of face: Secondary | ICD-10-CM | POA: Diagnosis not present

## 2015-12-24 DIAGNOSIS — F17200 Nicotine dependence, unspecified, uncomplicated: Secondary | ICD-10-CM | POA: Insufficient documentation

## 2015-12-24 DIAGNOSIS — Z79899 Other long term (current) drug therapy: Secondary | ICD-10-CM | POA: Insufficient documentation

## 2015-12-24 MED ORDER — SULFAMETHOXAZOLE-TRIMETHOPRIM 800-160 MG PO TABS
1.0000 | ORAL_TABLET | Freq: Once | ORAL | Status: AC
  Administered 2015-12-24: 1 via ORAL
  Filled 2015-12-24: qty 1

## 2015-12-24 MED ORDER — SULFAMETHOXAZOLE-TRIMETHOPRIM 800-160 MG PO TABS: 1.0000 | ORAL_TABLET | Freq: Two times a day (BID) | ORAL | Status: AC

## 2015-12-24 MED ORDER — IBUPROFEN 200 MG PO TABS
400.0000 mg | ORAL_TABLET | Freq: Once | ORAL | Status: AC
  Administered 2015-12-24: 400 mg via ORAL
  Filled 2015-12-24: qty 2

## 2015-12-24 MED ORDER — TRAMADOL HCL 50 MG PO TABS: 50.0000 mg | ORAL_TABLET | Freq: Four times a day (QID) | ORAL | Status: DC | PRN

## 2015-12-24 NOTE — ED Notes (Signed)
Pt started having bump to forehead and now with drainage.  Noted above left eye.  Painful.  Heat to left face with swelling.

## 2015-12-24 NOTE — ED Provider Notes (Signed)
CSN: [id number]     Arrival date & time 12/24/15  1503 History  By signing my name below, I, Tanda RockersMargaux Venter, attest that this documentation has been prepared under the direction and in the presence of Raeford RazorStephen Alayha Babineaux, MD. Electronically Signed: Tanda RockersMargaux Venter, ED Scribe. 12/24/2015. 4:24 PM.   Chief Complaint  Patient presents with  . Abscess  . Facial Pain   The history is provided by the patient. No language interpreter was used.   HPI Comments: Jodi Marks is a 30 y.o. female who presents to the Emergency Department complaining of gradual onset, constant, area of redness, swelling, and pain above left eyebrow that began a couple of days ago. Pt mentions that the area started out as a small red mark and has gradually grown in size and became more painful. She mentions having a mild headache when the abscess began to form as well as swelling to the left side of her face. This morning pt's left eye was slightly swollen as well. She has applied a warm compress to the area without relief. Pt has had slight drainage to the area as well. No hx DM. Denies fever, chills, or any other associated symptoms.   History reviewed. No pertinent past medical history. Past Surgical History  Procedure Laterality Date  . Dilation and curettage of uterus  2005  . Vaginal delivery      x2   History reviewed. No pertinent family history. Social History  Substance Use Topics  . Smoking status: Current Every Day Smoker  . Smokeless tobacco: Never Used  . Alcohol Use: No   OB History    No data available     Review of Systems  Constitutional: Negative for fever and chills.  HENT: Positive for facial swelling.   Skin: Positive for wound (abscess).  Neurological: Positive for headaches.  All other systems reviewed and are negative.  Allergies  Review of patient's allergies indicates no known allergies.  Home Medications   Prior to Admission medications   Medication Sig Start Date End Date Taking?  Authorizing Provider  medroxyPROGESTERone (DEPO-PROVERA) 150 MG/ML injection Inject into the muscle every 3 (three) months.   Yes Historical Provider, MD   BP 120/83 mmHg  Pulse 91  Temp(Src) 99 F (37.2 C) (Oral)  Resp 16  SpO2 100%   Physical Exam  Constitutional: She is oriented to person, place, and time. She appears well-developed and well-nourished. No distress.  HENT:  Head: Normocephalic and atraumatic.  Small carbuncle over left eyebrow. Surface is unroofed. No active drainage. No surrounding cellulitis. No fluctuance.  Mild left facial swelling.   Eyes: EOM are normal.  Neck: Normal range of motion.  Cardiovascular: Normal rate, regular rhythm and normal heart sounds.   Pulmonary/Chest: Effort normal and breath sounds normal.  Abdominal: Soft. She exhibits no distension. There is no tenderness.  Musculoskeletal: Normal range of motion.  Lymphadenopathy:       Head (left side): Preauricular adenopathy present.  Neurological: She is alert and oriented to person, place, and time.  Skin: Skin is warm and dry.  Psychiatric: She has a normal mood and affect. Judgment normal.  Nursing note and vitals reviewed.   ED Course  Procedures (including critical care time)  DIAGNOSTIC STUDIES: Oxygen Saturation is 100% on RA, normal by my interpretation.    COORDINATION OF CARE: 4:21 PM-Discussed treatment plan which includes Rx antibiotics with pt at bedside and pt agreed to plan.   Labs Review Labs Reviewed - No data to display  Imaging Review No results found. I have personally reviewed and evaluated these images and lab results as part of my medical decision-making.   EKG Interpretation None      MDM   Final diagnoses:  Facial abscess   30 year old female with a small facial abscess/carbuncle. There is no fluctuance. It is unroofed. No active drainage. I do not think that there is no significant collection to incise and drain at this time. Warm compresses.  Antibiotics. Wound care & return precautions were discussed.  I personally preformed the services scribed in my presence. The recorded information has been reviewed is accurate. Raeford Razor, MD.      Raeford Razor, MD 12/24/15 [phone]

## 2015-12-24 NOTE — Discharge Instructions (Signed)

## 2015-12-24 NOTE — ED Notes (Signed)
MD at bedside. 

## 2015-12-28 ENCOUNTER — Emergency Department (HOSPITAL_COMMUNITY): Payer: Medicaid Other

## 2015-12-28 ENCOUNTER — Encounter (HOSPITAL_COMMUNITY): Payer: Self-pay

## 2015-12-28 ENCOUNTER — Emergency Department (HOSPITAL_COMMUNITY)
Admission: EM | Admit: 2015-12-28 | Discharge: 2015-12-28 | Disposition: A | Discharge status: Home / Self Care | Payer: Medicaid Other | Attending: Emergency Medicine | Admitting: Emergency Medicine

## 2015-12-28 ENCOUNTER — Telehealth: Payer: Self-pay | Admitting: *Deleted

## 2015-12-28 DIAGNOSIS — K208 Other esophagitis without bleeding: Secondary | ICD-10-CM

## 2015-12-28 DIAGNOSIS — K029 Dental caries, unspecified: Secondary | ICD-10-CM | POA: Insufficient documentation

## 2015-12-28 DIAGNOSIS — Z79891 Long term (current) use of opiate analgesic: Secondary | ICD-10-CM | POA: Insufficient documentation

## 2015-12-28 DIAGNOSIS — R0602 Shortness of breath: Secondary | ICD-10-CM | POA: Diagnosis present

## 2015-12-28 DIAGNOSIS — K209 Esophagitis, unspecified: Secondary | ICD-10-CM | POA: Diagnosis not present

## 2015-12-28 DIAGNOSIS — T50905A Adverse effect of unspecified drugs, medicaments and biological substances, initial encounter: Secondary | ICD-10-CM

## 2015-12-28 DIAGNOSIS — Z79899 Other long term (current) drug therapy: Secondary | ICD-10-CM | POA: Insufficient documentation

## 2015-12-28 MED ORDER — OMEPRAZOLE 20 MG PO CPDR: 20.0000 mg | DELAYED_RELEASE_CAPSULE | Freq: Every day | ORAL | Status: DC

## 2015-12-28 MED ORDER — HYDROXYZINE HCL 25 MG PO TABS
50.0000 mg | ORAL_TABLET | Freq: Once | ORAL | Status: AC
  Administered 2015-12-28: 50 mg via ORAL
  Filled 2015-12-28: qty 2

## 2015-12-28 MED ORDER — GI COCKTAIL ~~LOC~~
30.0000 mL | Freq: Once | ORAL | Status: AC
  Administered 2015-12-28: 30 mL via ORAL
  Filled 2015-12-28: qty 30

## 2015-12-28 MED ORDER — PANTOPRAZOLE SODIUM 40 MG PO TBEC
40.0000 mg | DELAYED_RELEASE_TABLET | Freq: Every day | ORAL | Status: DC
  Administered 2015-12-28: 40 mg via ORAL
  Filled 2015-12-28: qty 1

## 2015-12-28 NOTE — Discharge Instructions (Signed)
Esophagitis °Esophagitis is inflammation of the esophagus. The esophagus is the tube that carries food and liquids from your mouth to your stomach. Esophagitis can cause soreness or pain in the esophagus. This condition can make it difficult and painful to swallow.  °CAUSES °Most causes of esophagitis are not serious. Common causes of this condition include: °· Gastroesophageal reflux disease (GERD). This is when stomach contents move back up into the esophagus (reflux). °· Repeated vomiting. °· An allergic-type reaction, especially caused by food allergies (eosinophilic esophagitis). °· Injury to the esophagus by swallowing large pills with or without water, or swallowing certain types of medicines. °· Swallowing (ingesting) harmful chemicals, such as household cleaning products. °· Heavy alcohol use. °· An infection of the esophagus. This most often occurs in people who have a weakened immune system. °· Radiation or chemotherapy treatment for cancer. °· Certain diseases such as sarcoidosis, Crohn disease, and scleroderma. °SYMPTOMS °Symptoms of this condition include: °· Difficult or painful swallowing. °· Pain with swallowing acidic liquids, such as citrus juices. °· Pain with burping. °· Chest pain. °· Difficulty breathing. °· Nausea. °· Vomiting. °· Pain in the abdomen. °· Weight loss. °· Ulcers in the mouth. °· Patches of white material in the mouth (candidiasis). °· Fever. °· Coughing up blood or vomiting blood. °· Stool that is black, tarry, or bright red. °DIAGNOSIS °Your health care provider will take a medical history and perform a physical exam. You may also have other tests, including: °· An endoscopy to examine your stomach and esophagus with a small camera. °· A test that measures the acidity level in your esophagus. °· A test that measures how much pressure is on your esophagus. °· A barium swallow or modified barium swallow to show the shape, size, and functioning of your esophagus. °· Allergy  tests. °TREATMENT °Treatment for this condition depends on the cause of your esophagitis. In some cases, steroids or other medicines may be given to help relieve your symptoms or to treat the underlying cause of your condition. You may have to make some lifestyle changes, such as: °· Avoiding alcohol. °· Quitting smoking. °· Changing your diet. °· Exercising. °· Changing your sleep habits and your sleep environment. °HOME CARE INSTRUCTIONS °Take these actions to decrease your discomfort and to help avoid complications. °Diet °· Follow a diet as recommended by your health care provider. This may involve avoiding foods and drinks such as: °¨ Coffee and tea (with or without caffeine). °¨ Drinks that contain alcohol. °¨ Energy drinks and sports drinks. °¨ Carbonated drinks or sodas. °¨ Chocolate and cocoa. °¨ Peppermint and mint flavorings. °¨ Garlic and onions. °¨ Horseradish. °¨ Spicy and acidic foods, including peppers, chili powder, curry powder, vinegar, hot sauces, and barbecue sauce. °¨ Citrus fruit juices and citrus fruits, such as oranges, lemons, and limes. °¨ Tomato-based foods, such as red sauce, chili, salsa, and pizza with red sauce. °¨ Fried and fatty foods, such as donuts, french fries, potato chips, and high-fat dressings. °¨ High-fat meats, such as hot dogs and fatty cuts of red and white meats, such as rib eye steak, sausage, ham, and bacon. °¨ High-fat dairy items, such as whole milk, butter, and cream cheese. °· Eat small, frequent meals instead of large meals. °· Avoid drinking large amounts of liquid with your meals. °· Avoid eating meals during the 2-3 hours before bedtime. °· Avoid lying down right after you eat. °· Do not exercise right after you eat. °· Avoid foods and drinks that seem to   make your symptoms worse. °General Instructions °· Pay attention to any changes in your symptoms. °· Take over-the-counter and prescription medicines only as told by your health care provider. Do not take  aspirin, ibuprofen, or other NSAIDs unless your health care provider told you to do so. °· If you have trouble taking pills, use a pill splitter to decrease the size of the pill. This will decrease the chance of the pill getting stuck or injuring your esophagus on the way down. Also, drink water after you take a pill. °· Do not use any tobacco products, including cigarettes, chewing tobacco, and e-cigarettes. If you need help quitting, ask your health care provider. °· Wear loose-fitting clothing. Do not wear anything tight around your waist that causes pressure on your abdomen. °· Raise (elevate) the head of your bed about 6 inches (15 cm). °· Try to reduce your stress, such as with yoga or meditation. If you need help reducing stress, ask your health care provider. °· If you are overweight, reduce your weight to an amount that is healthy for you. Ask your health care provider for guidance about a safe weight loss goal. °· Keep all follow-up visits as told by your health care provider. This is important. °SEEK MEDICAL CARE IF: °· You have new symptoms. °· You have unexplained weight loss. °· You have difficulty swallowing, or it hurts to swallow. °· You have wheezing or a persistent cough. °· Your symptoms do not improve with treatment. °· You have frequent heartburn for more than two weeks. °SEEK IMMEDIATE MEDICAL CARE IF: °· You have severe pain in your arms, neck, jaw, teeth, or back. °· You feel sweaty, dizzy, or light-headed. °· You have chest pain or shortness of breath. °· You vomit and your vomit looks like blood or coffee grounds. °· Your stool is bloody or black. °· You have a fever. °· You cannot swallow, drink, or eat. °  °This information is not intended to replace advice given to you by your health care provider. Make sure you discuss any questions you have with your health care provider. °  °Document Released: 07/06/2004 Document Revised: 02/17/2015 Document Reviewed: 09/23/2014 °[employer] Interactive  Patient Education ©2016 [employer] ° °

## 2015-12-28 NOTE — ED Notes (Signed)
Patient c/o SOB that began yesterday and has become worse this morning.  Patient also states is having centralized chest pain.  Denies nausea and vomiting. Patient states that has anxiety and does not take her medication on a regular basis.  States she was going to take one of her pills tonight and noticed that they were expired.  Patient rates her pain 6/10.

## 2015-12-28 NOTE — ED Notes (Signed)
Patient reports she has been taking Bactrim for recent abscess. Patient states the medication makes her feel like she has to burp or vomit after she takes it, states it feels like the medication is stuck in her throat. Patient also reports feeling anxious and hot.

## 2015-12-28 NOTE — ED Notes (Signed)
Patient drank approximately half of GI Cocktail and immediately vomited large amount of emesis. Notified provider.

## 2015-12-28 NOTE — ED Provider Notes (Signed)
CSN: [id number]     Arrival date & time 12/28/15  0143 History  By signing my name below, I, Vista Minkobert Ross, attest that this documentation has been prepared under the direction and in the presence of No att. providers found. Electronically signed, Vista Minkobert Ross, ED Scribe. 12/28/2015. 2:46 AM.    Chief Complaint  Patient presents with  . Shortness of Breath    The history is provided by the patient. No language interpreter was used.    HPI Comments: Jodi CarrowShavara Welle is a 30 y.o. female with a PMHx of GERD, Anxiety, who presents to the Emergency Department complaining of gradual onset, gradually worsening, shortness of breath onset this morning. Pt also complains of central upper chest pain and odynophagia.  [facility] for an I&D and was prescribed bactrim and reports feeling "weird" ever since. Pt reports a Hx of anxiety and panic attacks and states that this episode feels "a little similar".  She reports that she had the chest pain and odynophagia first followed by shortness of breath. She states that she's gotten very anxious about her symptoms. She denies any leg swelling or history of blood clots. No infectious symptoms. Reports nausea without vomiting. Pt has taken Pepto bismol with no relief. Pt denies heart palpitations, recent illness, cough, rhinorrhea.   History reviewed. No pertinent past medical history. Past Surgical History  Procedure Laterality Date  . Dilation and curettage of uterus  2005  . Vaginal delivery      x2   No family history on file. Social History  Substance Use Topics  . Smoking status: Current Every Day Smoker  . Smokeless tobacco: Never Used  . Alcohol Use: No   OB History    No data available     Review of Systems  Constitutional: Negative for fever.  HENT: Negative for rhinorrhea.   Respiratory: Positive for shortness of breath. Negative for cough.   Cardiovascular: Positive for chest pain (central, upper).  Gastrointestinal:  Positive for nausea. Negative for vomiting.  All other systems reviewed and are negative.     Allergies  Review of patient's allergies indicates no known allergies.  Home Medications   Prior to Admission medications   Medication Sig Start Date End Date Taking? Authorizing Provider  sulfamethoxazole-trimethoprim (BACTRIM DS,SEPTRA DS) 800-160 MG tablet Take 1 tablet by mouth 2 (two) times daily. 12/24/15 12/31/15 Yes Raeford RazorStephen Kohut, MD  traMADol (ULTRAM) 50 MG tablet Take 1 tablet (50 mg total) by mouth every 6 (six) hours as needed. 12/24/15  Yes Raeford RazorStephen Kohut, MD  medroxyPROGESTERone (DEPO-PROVERA) 150 MG/ML injection Inject 150 mg into the muscle every 3 (three) months.     Historical Provider, MD  omeprazole (PRILOSEC) 20 MG capsule Take 1 capsule (20 mg total) by mouth daily. 12/28/15   Shon Batonourtney F Sam Wunschel, MD   BP 112/79 mmHg  Pulse 79  Temp(Src) 98.4 F (36.9 C) (Oral)  Resp 18  SpO2 99% Physical Exam  Constitutional: She is oriented to person, place, and time. She appears well-developed and well-nourished.  HENT:  Head: Normocephalic and atraumatic.  Mouth/Throat: Oropharynx is clear and moist.  <1 cm wound left eyebrow, clean dry and intact, no surrounding erythema, fluctuance  Cardiovascular: Normal rate, regular rhythm and normal heart sounds.   No murmur heard. Pulmonary/Chest: Effort normal. No respiratory distress. She has no wheezes. She exhibits tenderness.  Abdominal: Soft. Bowel sounds are normal. There is no tenderness. There is no rebound.  Musculoskeletal: She exhibits no edema.  Neurological: She is alert and oriented to person, place, and time.  Skin: Skin is warm and dry.  Psychiatric: She has a normal mood and affect.  Nursing note and vitals reviewed.   ED Course  Procedures  DIAGNOSTIC STUDIES: Oxygen Saturation is 100% on RA, normal by my interpretation.  COORDINATION OF CARE: 2:43 AM-Will order GI cocktail. Discussed treatment plan with pt at  bedside and pt agreed to plan.   Labs Review Labs Reviewed - No data to display  Imaging Review Dg Chest 2 View  12/28/2015  CLINICAL DATA:  Shortness of breath EXAM: CHEST  2 VIEW COMPARISON:  November 15, 2014 FINDINGS: The heart size and mediastinal contours are within normal limits. Both lungs are clear. The visualized skeletal structures are unremarkable. IMPRESSION: No active cardiopulmonary disease. Electronically Signed   By: Gerome Sam III M.D   On: 12/28/2015 02:56   I have personally reviewed and evaluated these images and lab results as part of my medical decision-making.   EKG Interpretation   Date/Time:  Tuesday December 28 2015 01:55:46 EDT Ventricular Rate:  91 PR Interval:    QRS Duration: 82 QT Interval:  340 QTC Calculation: 419 R Axis:   57 Text Interpretation:  Sinus rhythm Borderline T wave abnormalities  Baseline wander in lead(s) V1 V2 V3 V4 Confirmed by Iren Whipp  MD, Kahner Yanik  ([id number]) on 12/28/2015 2:14:26 AM      MDM   Final diagnoses:  Pill esophagitis   Patient presents with chest pain,.aphasia, and shortness of breath. Nontoxic on exam. Mildly tachycardic but endorses anxiety.  She associates her symptoms with taking her Bactrim pills. She could have some pill esophagitis. She states that she does not drink a lot of water with her pills. EKG and chest x-ray are reassuring. While she is mildly tachycardic, she has no other risk factors for PE given the symptoms associated with taking her pill, doubt PE. Patient was given a GI cocktail. She vomited after taking a GI cocktail but reports after she vomited she felt much better. She is able to tolerate fluids. We'll place on a PPI for likely pill esophagitis. The site where her abscess was is clean dry and intact and appears to be just an abrasion at this time. No fluctuance. No surrounding erythema. Will discontinue Bactrim.  After history, exam, and medical workup I feel the patient has been appropriately  medically screened and is safe for discharge home. Pertinent diagnoses were discussed with the patient. Patient was given return precautions.  I personally performed the services described in this documentation, which was scribed in my presence. The recorded information has been reviewed and is accurate.      Shon Baton, MD 12/28/15 [phone]

## 2016-11-16 ENCOUNTER — Encounter (HOSPITAL_COMMUNITY): Payer: Self-pay

## 2016-11-16 ENCOUNTER — Emergency Department (HOSPITAL_COMMUNITY)
Admission: EM | Admit: 2016-11-16 | Discharge: 2016-11-16 | Disposition: A | Discharge status: Home / Self Care | Payer: Medicaid Other | Attending: Emergency Medicine | Admitting: Emergency Medicine

## 2016-11-16 DIAGNOSIS — K0889 Other specified disorders of teeth and supporting structures: Secondary | ICD-10-CM | POA: Diagnosis present

## 2016-11-16 DIAGNOSIS — F17200 Nicotine dependence, unspecified, uncomplicated: Secondary | ICD-10-CM | POA: Diagnosis not present

## 2016-11-16 MED ORDER — AMOXICILLIN 500 MG PO CAPS: 500.0000 mg | ORAL_CAPSULE | Freq: Three times a day (TID) | ORAL | 0 refills | Status: AC

## 2016-11-16 MED ORDER — TRAMADOL HCL 50 MG PO TABS: 50.0000 mg | ORAL_TABLET | Freq: Four times a day (QID) | ORAL | 0 refills | Status: AC | PRN

## 2016-11-16 NOTE — ED Provider Notes (Signed)
[facility]-EMERGENCY DEPT Provider Note   CSN: [id number] Arrival date & time: 11/16/16  1802  By signing my name below, I, Jodi NeighborsMaurice Deon Copeland Jr., attest that this documentation has been prepared under the direction and in the presence of Jodi Marks, [place]. Electronically signed: [name], ED Scribe. 11/16/16. 8:02 PM.   History   Chief Complaint Chief Complaint  Patient presents with  . Dental Pain    HPI Jodi Marks is a 31 y.o. female with hx of dental abscess who presents to the Emergency Department complaining of intermittent, worsening L sided dental pain with onset x2 days. Pt states that she has had chronic dental pain since fracturing a tooth x7 months. For the past x2 days she reports having dental pain that has made it difficult for her to eat. She states that pain is worsened by eating. She states that she has been able to swallow without any difficulty. She reportedly went to her dentist today and was unable to be seen due to her dentist no longer taking adult patients. She reports facial swelling. Pt has taken BC powder and Ibuprofen with no relief. She denies difficulty swallowing, fever, sore throat. Pt denies known allergy to medication. Of note, pt is a smoker. Patient denies any fever, difficulty swelling, sore throat, difficulty breathing  The history is provided by the patient.    History reviewed. No pertinent past medical history.  There are no active problems to display for this patient.   Past Surgical History:  Procedure Laterality Date  . DILATION AND CURETTAGE OF UTERUS  2005  . VAGINAL DELIVERY     x2    OB History    No data available       Home Medications    Prior to Admission medications   Medication Sig Start Date End Date Taking? Authorizing Provider  amoxicillin (AMOXIL) 500 MG capsule Take 1 capsule (500 mg total) by mouth 3 (three) times daily. 11/16/16   Maxwell CaulLayden, Arletha Marschke A, PA-C  medroxyPROGESTERone (DEPO-PROVERA) 150  MG/ML injection Inject 150 mg into the muscle every 3 (three) months.     [provider]  omeprazole (PRILOSEC) 20 MG capsule Take 1 capsule (20 mg total) by mouth daily. 12/28/15   Horton, Mayer Maskerourtney F, MD  traMADol (ULTRAM) 50 MG tablet Take 1 tablet (50 mg total) by mouth every 6 (six) hours as needed. 11/16/16   Maxwell CaulLayden, Evgenia Merriman A, PA-C    Family History History reviewed. No pertinent family history.  Social History Social History  Substance Use Topics  . Smoking status: Current Every Day Smoker  . Smokeless tobacco: Never Used  . Alcohol use No     Allergies   Patient has no known allergies.   Review of Systems Review of Systems  Constitutional: Negative for fever.  HENT: Positive for dental problem and facial swelling. Negative for sore throat and trouble swallowing.   Respiratory: Negative for shortness of breath.   Gastrointestinal: Negative for vomiting.  All other systems reviewed and are negative.    Physical Exam Updated Vital Signs BP 123/77 (BP Location: Left Arm)   Pulse 96   Temp 98.5 F (36.9 C) (Axillary)   Resp 16   SpO2 100%   Physical Exam  Constitutional: She appears well-developed and well-nourished.  Sitting comfortably on examination table  HENT:  Head: Normocephalic and atraumatic.  Mouth/Throat: Uvula is midline, oropharynx is clear and moist and mucous membranes are normal.    Diffuse evidence of dental decay and  caries. Fractured tooth as documented in a graphic. No surrounding gingival erythema or swelling. No trismus. No appreciable facial swelling. No evidence of dental abscess. Uvula is midline. No neck swelling. No evidence of peritonsillar abscess.  Eyes: Conjunctivae and EOM are normal. Right eye exhibits no discharge. Left eye exhibits no discharge. No scleral icterus.  Pulmonary/Chest: Effort normal.  Neurological: She is alert.  Skin: Skin is warm and dry.  Psychiatric: She has a normal mood and affect. Her speech is  normal and behavior is normal.  Nursing note and vitals reviewed.    ED Treatments / Results   DIAGNOSTIC STUDIES: Oxygen Saturation is 100% on RA, normal by my interpretation.   COORDINATION OF CARE: 8:02 PM-Discussed next steps with pt. Pt verbalized understanding and is agreeable with the plan.    Labs (all labs ordered are listed, but only abnormal results are displayed) Labs Reviewed - No data to display  EKG  EKG Interpretation None       Radiology No results found.  Procedures Procedures (including critical care time)  Medications Ordered in ED Medications - No data to display   Initial Impression / Assessment and Plan / ED Course  I have reviewed the triage vital signs and the nursing notes.  Pertinent labs & imaging results that were available during my care of the patient were reviewed by me and considered in my medical decision making (see chart for details).     31 year old female who presents with 3 days of dental pain. Patient has history of a fractured tooth dictating several months ago. No history of fever, difficulty swallowing. Patient is afebrile, non-toxic appearing, sitting comfortably on examination table. Initially patient's voice sounds distorted and she is reluctant to open her mouth. Throughout examination the voice abnormality resolved and she is back to normal speaking voice. Patient guards her mouth and often talks through gritted teeth to avoid opening closing her mouth for help with pain. Patient is distracted, she easily lasts and returns to normal speaking voice. She has no trismus on exam. History/physical exam are not concerning for dental abscess, peritonsillar abscess, Ludwig angina. No evidence of dental abscess on physical exam. It looks like the gum has overgrown to cover the hole where part of the tooth is missing. There is no palpable fluctuance or any evidence that needs drainage. Discussed with patient regarding findings.  Instructed her that she needs to follow-up with a dentist for further evaluation and possible extraction of the tooth. Plan to provide her with outpatient dental referral clinics. Instructed patient to take NSAIDs for pain relief. Patient states that she has RAD been taking BC powder and ibuprofen with no relief. Offered patient a dental block to help with pain but patient declines at this time.   Patient looked up on [place] substance database. She has not received any recent narcotics. Spleen that I give her short course of tramadol until she is able to see the dentist. But no other narcotic occasions. Patient is agreeable. Patient will also be placed on antibiotics for potential coverage. Instructed patient to follow-up with referred dental clinics. Strict return precautions discussed. Patient expresses understanding and agreement to plan.   Final Clinical Impressions(s) / ED Diagnoses   Final diagnoses:  Pain, dental    New Prescriptions Discharge Medication List as of 11/16/2016  7:33 PM    START taking these medications   Details  amoxicillin (AMOXIL) 500 MG capsule Take 1 capsule (500 mg total) by mouth 3 (three) times daily.,  Starting Thu 11/16/2016, Print       I personally performed the services described in this documentation, which was scribed in my presence. The recorded information has been reviewed and is accurate.     Maxwell Caul, PA-C 11/16/16 Eyvonne Left, MD 11/17/16 [phone]

## 2016-11-16 NOTE — ED Triage Notes (Signed)
Pt presents w/ 10/10 left sided dental pain and swelling. Pt reports hx of dental abscess.

## 2016-11-16 NOTE — Discharge Instructions (Signed)
Take antibiotics as directed. Please take all of your antibiotics until finished.   You have been seen in the emergency department for dental pain. The exam and treatment you received today has been provided on an emergency basis only. This is not a substitute for complete medical or dental care. If your problem worsens or new symptoms (problems) appear, and you are unable to arrange prompt follow-up care with your dentist, call or return to this location. If you do not have a dentist, please follow-up with one on the list provided  CALL YOUR DENTIST OR RETURN IMMEDIATELY IF you develop a fever, rash, difficulty breathing or swallowing, neck or facial swelling, or other potentially serious concerns.

## 2017-10-12 ENCOUNTER — Encounter (HOSPITAL_COMMUNITY): Payer: Self-pay | Admitting: Emergency Medicine

## 2017-10-12 ENCOUNTER — Emergency Department (HOSPITAL_COMMUNITY)
Admission: EM | Admit: 2017-10-12 | Discharge: 2017-10-12 | Disposition: A | Discharge status: Home / Self Care | Payer: Self-pay | Attending: Emergency Medicine | Admitting: Emergency Medicine

## 2017-10-12 ENCOUNTER — Emergency Department (HOSPITAL_COMMUNITY): Payer: Self-pay

## 2017-10-12 DIAGNOSIS — R197 Diarrhea, unspecified: Secondary | ICD-10-CM | POA: Insufficient documentation

## 2017-10-12 DIAGNOSIS — R112 Nausea with vomiting, unspecified: Secondary | ICD-10-CM | POA: Insufficient documentation

## 2017-10-12 DIAGNOSIS — F17210 Nicotine dependence, cigarettes, uncomplicated: Secondary | ICD-10-CM | POA: Insufficient documentation

## 2017-10-12 DIAGNOSIS — R1032 Left lower quadrant pain: Secondary | ICD-10-CM | POA: Insufficient documentation

## 2017-10-12 LAB — COMPREHENSIVE METABOLIC PANEL
ALT: 17 U/L (ref 14–54)
ANION GAP: 10 (ref 5–15)
AST: 17 U/L (ref 15–41)
Albumin: 4.2 g/dL (ref 3.5–5.0)
Alkaline Phosphatase: 92 U/L (ref 38–126)
BILIRUBIN TOTAL: 1.6 mg/dL — AB (ref 0.3–1.2)
BUN: 11 mg/dL (ref 6–20)
CO2: 22 mmol/L (ref 22–32)
Calcium: 9.4 mg/dL (ref 8.9–10.3)
Chloride: 108 mmol/L (ref 101–111)
Creatinine, Ser: 0.68 mg/dL (ref 0.44–1.00)
GFR calc Af Amer: >60 mL/min (ref >60)
Glucose, Bld: 105 mg/dL — ABNORMAL HIGH (ref 65–99)
POTASSIUM: 4 mmol/L (ref 3.5–5.1)
Sodium: 140 mmol/L (ref 135–145)
TOTAL PROTEIN: 8.2 g/dL — AB (ref 6.5–8.1)

## 2017-10-12 LAB — CBC
HEMATOCRIT: 39.1 % (ref 36.0–46.0)
Hemoglobin: 13.2 g/dL (ref 12.0–15.0)
MCH: 31 pg (ref 26.0–34.0)
MCHC: 33.8 g/dL (ref 30.0–36.0)
MCV: 91.8 fL (ref 78.0–100.0)
Platelets: 268 10*3/uL (ref 150–400)
RBC: 4.26 MIL/uL (ref 3.87–5.11)
RDW: 12.8 % (ref 11.5–15.5)
WBC: 6.9 10*3/uL (ref 4.0–10.5)

## 2017-10-12 LAB — URINALYSIS, ROUTINE W REFLEX MICROSCOPIC
BACTERIA UA: NONE SEEN
BILIRUBIN URINE: NEGATIVE
GLUCOSE, UA: NEGATIVE mg/dL
Ketones, ur: 20 mg/dL — AB
LEUKOCYTES UA: NEGATIVE
NITRITE: NEGATIVE
PH: 5 (ref 5.0–8.0)
Protein, ur: NEGATIVE mg/dL
SPECIFIC GRAVITY, URINE: 1.021 (ref 1.005–1.030)

## 2017-10-12 LAB — I-STAT BETA HCG BLOOD, ED (MC, WL, AP ONLY)

## 2017-10-12 LAB — LIPASE, BLOOD: Lipase: 21 U/L (ref 11–51)

## 2017-10-12 MED ORDER — ONDANSETRON 8 MG PO TBDP: 8.0000 mg | ORAL_TABLET | Freq: Three times a day (TID) | ORAL | 0 refills | Status: AC | PRN

## 2017-10-12 MED ORDER — IOPAMIDOL (ISOVUE-300) INJECTION 61%
INTRAVENOUS | Status: AC
  Filled 2017-10-12: qty 100

## 2017-10-12 MED ORDER — ONDANSETRON HCL 4 MG/2ML IJ SOLN
4.0000 mg | Freq: Once | INTRAMUSCULAR | Status: AC
  Administered 2017-10-12: 4 mg via INTRAVENOUS
  Filled 2017-10-12: qty 2

## 2017-10-12 MED ORDER — IOPAMIDOL (ISOVUE-300) INJECTION 61%
100.0000 mL | Freq: Once | INTRAVENOUS | Status: AC | PRN
  Administered 2017-10-12: 100 mL via INTRAVENOUS

## 2017-10-12 MED ORDER — IOHEXOL 300 MG/ML  SOLN
30.0000 mL | Freq: Once | INTRAMUSCULAR | Status: AC | PRN
  Administered 2017-10-12: 30 mL via ORAL

## 2017-10-12 MED ORDER — SODIUM CHLORIDE 0.9 % IJ SOLN
INTRAMUSCULAR | Status: AC
  Filled 2017-10-12: qty 50

## 2017-10-12 MED ORDER — SODIUM CHLORIDE 0.9 % IV BOLUS
1000.0000 mL | Freq: Once | INTRAVENOUS | Status: AC
  Administered 2017-10-12: 1000 mL via INTRAVENOUS

## 2017-10-12 NOTE — Discharge Instructions (Addendum)
Your CT scan today was normal.  I think you have a viral gastrointestinal infection that is causing your symptoms.  Take Zofran as prescribed as needed for nausea vomiting.  Follow-up with family doctor as needed. Return if worsening.

## 2017-10-12 NOTE — ED Provider Notes (Signed)
[facility]-EMERGENCY DEPT Provider Note   CSN: [id number] Arrival date & time: 10/12/17  1024     History   Chief Complaint Chief Complaint  Patient presents with  . Abdominal Pain  . Emesis  . Nausea    HPI Jodi Marks is a 32 y.o. female.  HPI Jodi Marks is a 32 y.o. female to emergency department complaining of abdominal pain, nausea, vomiting.  Patient states that her symptoms started last night.  She states she did eat brisket at a baseball game and had one glass of wine.  She thinks she may have food poisoning from that.  She states she has had too numerous to count episodes of nausea, vomiting, diarrhea.  She states that her emesis is clear yellow, and stool is watery.  She states she is having severe left lower quadrant abdominal pain is associated with her symptoms.  She states nothing is making her symptoms better or worse.  She has not tried any medications.  She is unable to keep anything down.  She reports subjective fever and chills.  She states that she feels sweaty.  She is not pregnant.  No urinary symptoms.  History reviewed. No pertinent past medical history.  There are no active problems to display for this patient.   Past Surgical History:  Procedure Laterality Date  . DILATION AND CURETTAGE OF UTERUS  2005  . VAGINAL DELIVERY     x2     OB History   None      Home Medications    Prior to Admission medications   Medication Sig Start Date End Date Taking? Authorizing Provider  medroxyPROGESTERone (DEPO-PROVERA) 150 MG/ML injection Inject 150 mg into the muscle every 3 (three) months.    Yes [provider]  amoxicillin (AMOXIL) 500 MG capsule Take 1 capsule (500 mg total) by mouth 3 (three) times daily. Patient not taking: Reported on 10/12/2017 11/16/16   Maxwell Caul, PA-C  omeprazole (PRILOSEC) 20 MG capsule Take 1 capsule (20 mg total) by mouth daily. Patient not taking: Reported on 10/12/2017 12/28/15   Horton,  Mayer Masker, MD  traMADol (ULTRAM) 50 MG tablet Take 1 tablet (50 mg total) by mouth every 6 (six) hours as needed. Patient not taking: Reported on 10/12/2017 11/16/16   Maxwell Caul, PA-C    Family History No family history on file.  Social History Social History   Tobacco Use  . Smoking status: Current Every Day Smoker  . Smokeless tobacco: Never Used  Substance Use Topics  . Alcohol use: No  . Drug use: No     Allergies   Patient has no known allergies.   Review of Systems Review of Systems  Constitutional: Negative for chills and fever.  Respiratory: Negative for cough, chest tightness and shortness of breath.   Cardiovascular: Negative for chest pain, palpitations and leg swelling.  Gastrointestinal: Positive for abdominal pain, diarrhea, nausea and vomiting.  Genitourinary: Negative for dysuria, flank pain and pelvic pain.  Musculoskeletal: Negative for arthralgias, myalgias, neck pain and neck stiffness.  Skin: Negative for rash.  Neurological: Negative for dizziness, weakness and headaches.  All other systems reviewed and are negative.    Physical Exam Updated Vital Signs BP 124/72 (BP Location: Right Arm)   Pulse 100   Temp 97.9 F (36.6 C)   Resp 18   SpO2 100%   Physical Exam  Constitutional: She is oriented to person, place, and time. She appears well-developed and well-nourished. No distress.  HENT:  Head: Normocephalic and atraumatic.  Eyes: Pupils are equal, round, and reactive to light. Conjunctivae and EOM are normal.  Neck: Normal range of motion. Neck supple.  Cardiovascular: Normal rate, regular rhythm and normal heart sounds.  Pulmonary/Chest: Effort normal and breath sounds normal. No respiratory distress. She has no wheezes. She has no rales.  Abdominal: Soft. Bowel sounds are normal. She exhibits no distension. There is tenderness. There is no rebound.  LLQ tenderness  Musculoskeletal: Normal range of motion. She exhibits no edema.    Neurological: She is alert and oriented to person, place, and time. No cranial nerve deficit. Coordination normal.  Skin: Skin is warm and dry.  Psychiatric: She has a normal mood and affect. Her behavior is normal.  Nursing note and vitals reviewed.    ED Treatments / Results  Labs (all labs ordered are listed, but only abnormal results are displayed) Labs Reviewed  COMPREHENSIVE METABOLIC PANEL - Abnormal; Notable for the following components:      Result Value   Glucose, Bld 105 (*)    Total Protein 8.2 (*)    Total Bilirubin 1.6 (*)    All other components within normal limits  URINALYSIS, ROUTINE W REFLEX MICROSCOPIC - Abnormal; Notable for the following components:   APPearance HAZY (*)    Hgb urine dipstick SMALL (*)    Ketones, ur 20 (*)    All other components within normal limits  LIPASE, BLOOD  CBC  I-STAT BETA HCG BLOOD, ED ([facility], [facility], [facility] ONLY)    EKG None  Radiology Ct Abdomen Pelvis W Contrast  Result Date: 10/12/2017 CLINICAL DATA:  Left-sided abdominal pain with nausea and vomiting that began last night. EXAM: CT ABDOMEN AND PELVIS WITH CONTRAST TECHNIQUE: Multidetector CT imaging of the abdomen and pelvis was performed using the standard protocol following bolus administration of intravenous contrast. CONTRAST:  ISOVUE-300 IOPAMIDOL (ISOVUE-300) INJECTION 61% COMPARISON:  None. FINDINGS: Lower chest: No acute abnormality. Hepatobiliary: No focal liver abnormality is seen. No gallstones, gallbladder wall thickening, or biliary dilatation. Pancreas: Unremarkable. No pancreatic ductal dilatation or surrounding inflammatory changes. Spleen: Normal in size without focal abnormality. Adrenals/Urinary Tract: Adrenal glands are unremarkable. Kidneys are normal, without renal calculi, focal lesion, or hydronephrosis. Bladder is unremarkable. Stomach/Bowel: Stomach is within normal limits. Appendix appears normal. No evidence of bowel wall thickening, distention, or  inflammatory changes. Vascular/Lymphatic: No significant vascular findings are present. No enlarged abdominal or pelvic lymph nodes. Reproductive: Uterus and bilateral adnexa are unremarkable. Other: Trace free fluid in the pelvis is likely physiologic. No pneumoperitoneum. Small fat containing umbilical hernia. Musculoskeletal: No acute or significant osseous findings. IMPRESSION: 1.  No acute intra-abdominal process. Electronically Signed   By: Obie Dredge M.D.   On: 10/12/2017 15:37    Procedures Procedures (including critical care time)  Medications Ordered in ED Medications  iopamidol (ISOVUE-300) 61 % injection (has no administration in time range)  sodium chloride 0.9 % injection (has no administration in time range)  sodium chloride 0.9 % bolus 1,000 mL (0 mLs Intravenous Stopped 10/12/17 1318)  ondansetron (ZOFRAN) injection 4 mg (4 mg Intravenous Given 10/12/17 1224)  iohexol (OMNIPAQUE) 300 MG/ML solution 30 mL (30 mLs Oral Contrast Given 10/12/17 1335)  iopamidol (ISOVUE-300) 61 % injection 100 mL (100 mLs Intravenous Contrast Given 10/12/17 1516)     Initial Impression / Assessment and Plan / ED Course  I have reviewed the triage vital signs and the nursing notes.  Pertinent labs & imaging results that  were available during my care of the patient were reviewed by me and considered in my medical decision making (see chart for details).    Patient in emergency department with nausea, vomiting, diarrhea.  Nontoxic-appearing, vital signs normal.  She does however states she has severe pain in the left lower abdomen.  She is tender in the left lower quadrant.  Will get CT abdomen pelvis to rule out diverticulitis versus colitis.   CT is negative.  Labs unremarkable.  Patient is drinking without vomiting.  She is feeling better.  Plan to discharge home with Zofran.  Most likely viral gastroenteritis.  Follow-up with family doctor.  Return precautions discussed.  Vitals:   10/12/17  1031 10/12/17 1312 10/12/17 1400  BP: 124/72 102/71 109/73  Pulse: 100 79 80  Resp: Temp: 97.9 F (36.6 C) 98.3 F (36.8 C)   TempSrc:  Oral   SpO2: 100% 100% 100%     Final Clinical Impressions(s) / ED Diagnoses   Final diagnoses:  Nausea vomiting and diarrhea    ED Discharge Orders        Ordered    ondansetron (ZOFRAN ODT) 8 MG disintegrating tablet  Every 8 hours PRN     10/12/17 1556       Gerad Cornelio, Gisela, PA-C 10/12/17 1559    Lorre Nick, MD 10/14/17 2311

## 2017-10-12 NOTE — ED Triage Notes (Signed)
Patient here from home with complaints of left sided abdominal pain, nausea, vomiting that started last night. Reports "I think I have food poisoning .

## 2018-09-15 ENCOUNTER — Emergency Department (HOSPITAL_COMMUNITY): Payer: Medicaid Other

## 2018-09-15 ENCOUNTER — Emergency Department (HOSPITAL_COMMUNITY)
Admission: EM | Admit: 2018-09-15 | Discharge: 2018-09-15 | Disposition: A | Discharge status: Home / Self Care | Payer: Medicaid Other | Attending: Emergency Medicine | Admitting: Emergency Medicine

## 2018-09-15 ENCOUNTER — Encounter (HOSPITAL_COMMUNITY): Payer: Self-pay | Admitting: *Deleted

## 2018-09-15 ENCOUNTER — Other Ambulatory Visit: Payer: Self-pay

## 2018-09-15 DIAGNOSIS — F17200 Nicotine dependence, unspecified, uncomplicated: Secondary | ICD-10-CM | POA: Insufficient documentation

## 2018-09-15 DIAGNOSIS — K219 Gastro-esophageal reflux disease without esophagitis: Secondary | ICD-10-CM | POA: Insufficient documentation

## 2018-09-15 DIAGNOSIS — Z79899 Other long term (current) drug therapy: Secondary | ICD-10-CM | POA: Insufficient documentation

## 2018-09-15 LAB — COMPREHENSIVE METABOLIC PANEL
ALT: 18 U/L (ref 0–44)
AST: 16 U/L (ref 15–41)
Albumin: 4.1 g/dL (ref 3.5–5.0)
Alkaline Phosphatase: 83 U/L (ref 38–126)
Anion gap: 6 (ref 5–15)
BUN: 9 mg/dL (ref 6–20)
CO2: 22 mmol/L (ref 22–32)
Calcium: 8.6 mg/dL — ABNORMAL LOW (ref 8.9–10.3)
Chloride: 111 mmol/L (ref 98–111)
Creatinine, Ser: 0.76 mg/dL (ref 0.44–1.00)
GFR calc Af Amer: >60 mL/min (ref >60)
GFR calc non Af Amer: >60 mL/min (ref >60)
Glucose, Bld: 103 mg/dL — ABNORMAL HIGH (ref 70–99)
Potassium: 3.4 mmol/L — ABNORMAL LOW (ref 3.5–5.1)
Sodium: 139 mmol/L (ref 135–145)
Total Bilirubin: 0.7 mg/dL (ref 0.3–1.2)
Total Protein: 7.5 g/dL (ref 6.5–8.1)

## 2018-09-15 LAB — CBC
HCT: 36.3 % (ref 36.0–46.0)
Hemoglobin: 11.8 g/dL — ABNORMAL LOW (ref 12.0–15.0)
MCH: 31.6 pg (ref 26.0–34.0)
MCHC: 32.5 g/dL (ref 30.0–36.0)
MCV: 97.3 fL (ref 80.0–100.0)
Platelets: 256 10*3/uL (ref 150–400)
RBC: 3.73 MIL/uL — ABNORMAL LOW (ref 3.87–5.11)
RDW: 13 % (ref 11.5–15.5)
WBC: 5.7 10*3/uL (ref 4.0–10.5)
nRBC: 0 % (ref 0.0–0.2)

## 2018-09-15 LAB — D-DIMER, QUANTITATIVE: D-Dimer, Quant: <0.27 ug/mL-FEU (ref 0.00–0.50)

## 2018-09-15 LAB — TROPONIN I: Troponin I: <0.03 ng/mL (ref <0.03)

## 2018-09-15 LAB — I-STAT BETA HCG BLOOD, ED (MC, WL, AP ONLY): I-stat hCG, quantitative: <5 m[IU]/mL (ref <5)

## 2018-09-15 MED ORDER — LIDOCAINE VISCOUS HCL 2 % MT SOLN
15.0000 mL | Freq: Once | OROMUCOSAL | Status: AC
Start: 2018-09-15 — End: 2018-09-15
  Administered 2018-09-15: 15 mL via ORAL
  Filled 2018-09-15: qty 15

## 2018-09-15 MED ORDER — ALUM & MAG HYDROXIDE-SIMETH 200-200-20 MG/5ML PO SUSP
30.0000 mL | Freq: Once | ORAL | Status: AC
  Administered 2018-09-15: 05:00:00 30 mL via ORAL
  Filled 2018-09-15: qty 30

## 2018-09-15 MED ORDER — OMEPRAZOLE 20 MG PO CPDR: 20.0000 mg | DELAYED_RELEASE_CAPSULE | Freq: Every day | ORAL | 0 refills | Status: AC

## 2018-09-15 MED ORDER — LIDOCAINE VISCOUS HCL 2 % MT SOLN: 15.0000 mL | OROMUCOSAL | 0 refills | Status: AC | PRN

## 2018-09-15 MED ORDER — ACETAMINOPHEN 500 MG PO TABS
1000.0000 mg | ORAL_TABLET | Freq: Once | ORAL | Status: DC
  Filled 2018-09-15: qty 2

## 2018-09-15 NOTE — Discharge Instructions (Signed)
Thank you for allowing me to care for you today in the Emergency Department.   The GI cocktail that you were given in the ER today contains Maalox, which is available over-the-counter, and viscous lidocaine, which I have given you a prescription for.  You may swallow 15 mL's of viscous lidocaine every 3 hours as needed for heartburn or sore throat.  Try taking 1 tablet of omeprazole daily for the next 2 weeks.  I have also attached some information on how to reduce your symptoms without medication, which includes not eating for at least 3 hours before you go to bed and sleeping at a 30 degree incline.  I have also attached them food choices which may help to prevent heartburn.  Return to the emergency department if you develop respiratory distress, severe shortness of breath with a high fever, cough, vomiting, if you pass out, or other new, concerning symptoms.

## 2018-09-15 NOTE — ED Provider Notes (Signed)
[facility]-EMERGENCY DEPT Provider Note   CSN: [id number] Arrival date & time: 09/15/18  0445    History   Chief Complaint Chief Complaint  Patient presents with  . Chest Pain    HPI Jodi Marks is a 33 y.o. female with no pertinent past medical history who presents to the emergency department from home with a chief complaint of chest pain.  The patient reports left-sided pleuritic chest pain that began at 2300.  She reports associated shortness of breath with inspiration.  She reports the symptoms began while she was chain smoking (4-5 cigarettes) while playing online games.  It is characterized as pressure-like.  She reports she is also having some discomfort in the left side of her neck, which she describes as "you know when you sleep on your neck wrong and have a crick in it?"  She thought she may feel better if she was able to vomit or belch so she attempted to make herself throw up, but was unsuccessful.  No recent trauma or injuries.  She denies cough, fever, chills, palpitations, wheezing, leg swelling, orthopnea, diaphoresis, nausea, vomiting, diarrhea, rash, or abdominal pain.  She denies recent increase in GI discomfort, including burping or belching.  She reports that she is a current, every day smoker.  No other treatment prior to arrival.  She reports that she is currently on the Depo injection for birth control.  The last injection was sometime in January February.  She reports that her aunt has a history of PE.  No recent surgery, immobilization, or travel.     The history is provided by the patient. No language interpreter was used.    History reviewed. No pertinent past medical history.  There are no active problems to display for this patient.   Past Surgical History:  Procedure Laterality Date  . DILATION AND CURETTAGE OF UTERUS  2005  . VAGINAL DELIVERY     x2     OB History   No obstetric history on file.      Home Medications     Prior to Admission medications   Medication Sig Start Date End Date Taking? Authorizing Provider  amoxicillin (AMOXIL) 500 MG capsule Take 1 capsule (500 mg total) by mouth 3 (three) times daily. Patient not taking: Reported on 10/12/2017 11/16/16   Graciella Freer A, PA-C  lidocaine (XYLOCAINE) 2 % solution Use as directed 15 mLs in the mouth or throat every 3 (three) hours as needed for mouth pain. 09/15/18   Abeeha Twist A, PA-C  medroxyPROGESTERone (DEPO-PROVERA) 150 MG/ML injection Inject 150 mg into the muscle every 3 (three) months.     [provider]  omeprazole (PRILOSEC) 20 MG capsule Take 1 capsule (20 mg total) by mouth daily. 09/15/18   Mirko Tailor A, PA-C  ondansetron (ZOFRAN ODT) 8 MG disintegrating tablet Take 1 tablet (8 mg total) by mouth every 8 (eight) hours as needed for nausea or vomiting. 10/12/17   Kirichenko, Tatyana, PA-C  traMADol (ULTRAM) 50 MG tablet Take 1 tablet (50 mg total) by mouth every 6 (six) hours as needed. Patient not taking: Reported on 10/12/2017 11/16/16   Maxwell Caul, PA-C    Family History No family history on file.  Social History Social History   Tobacco Use  . Smoking status: Current Every Day Smoker  . Smokeless tobacco: Never Used  Substance Use Topics  . Alcohol use: Yes  . Drug use: No     Allergies   Patient  has no known allergies.   Review of Systems Review of Systems  Constitutional: Negative for activity change, chills and fever.  HENT: Negative for congestion, sinus pressure and sinus pain.   Respiratory: Positive for shortness of breath. Negative for cough and wheezing.   Cardiovascular: Positive for chest pain. Negative for palpitations and leg swelling.  Gastrointestinal: Negative for abdominal pain, diarrhea, nausea and vomiting.  Genitourinary: Negative for dysuria.  Musculoskeletal: Negative for back pain.  Skin: Negative for rash.  Allergic/Immunologic: Negative for immunocompromised state.   Neurological: Negative for dizziness, weakness, numbness and headaches.  Psychiatric/Behavioral: Negative for confusion.   Physical Exam Updated Vital Signs BP 129/77   Pulse 99   Temp 98.2 F (36.8 C) (Oral)   Resp 18   Ht 5\' 6"  (1.676 m)   Wt 93.9 kg   SpO2 100%   BMI 33.41 kg/m   Physical Exam Vitals signs and nursing note reviewed.  Constitutional:      General: She is not in acute distress. HENT:     Head: Normocephalic.  Eyes:     Extraocular Movements: Extraocular movements intact.     Conjunctiva/sclera: Conjunctivae normal.     Pupils: Pupils are equal, round, and reactive to light.  Neck:     Musculoskeletal: Normal range of motion and neck supple. No neck rigidity or muscular tenderness.  Cardiovascular:     Rate and Rhythm: Normal rate and regular rhythm.     Pulses: Normal pulses.     Heart sounds: Normal heart sounds. No murmur. No friction rub. No gallop.      Comments: Heart is regular rate and rhythm without murmurs rubs or gallops.  No tachycardia. Pulmonary:     Effort: Pulmonary effort is normal. No respiratory distress.     Breath sounds: No stridor. No wheezing, rhonchi or rales.     Comments: No accessory muscle use, retractions, or nasal flaring.  Chest:     Chest wall: No tenderness.  Abdominal:     General: There is no distension.     Palpations: Abdomen is soft. There is no mass.     Tenderness: There is no abdominal tenderness. There is no right CVA tenderness, left CVA tenderness, guarding or rebound.     Hernia: No hernia is present.  Musculoskeletal:     Right lower leg: No edema.     Left lower leg: No edema.  Lymphadenopathy:     Cervical: No cervical adenopathy.  Skin:    General: Skin is warm.     Capillary Refill: Capillary refill takes less than 2 seconds.     Findings: No rash.  Neurological:     Mental Status: She is alert.  Psychiatric:        Behavior: Behavior normal.      ED Treatments / Results  Labs (all labs  ordered are listed, but only abnormal results are displayed) Labs Reviewed  CBC - Abnormal; Notable for the following components:      Result Value   RBC 3.73 (*)    Hemoglobin 11.8 (*)    All other components within normal limits  COMPREHENSIVE METABOLIC PANEL - Abnormal; Notable for the following components:   Potassium 3.4 (*)    Glucose, Bld 103 (*)    Calcium 8.6 (*)    All other components within normal limits  D-DIMER, QUANTITATIVE (NOT AT [facility])  TROPONIN I  I-STAT BETA HCG BLOOD, ED ([facility], [facility], [facility] ONLY)    EKG EKG Interpretation  Date/Time:  Sunday September 15 2018 05:05:58 EDT Ventricular Rate:  86 PR Interval:    QRS Duration: 74 QT Interval:  362 QTC Calculation: 433 R Axis:   54 Text Interpretation:  Age not entered, assumed to be  33 years old for purpose of ECG interpretation Sinus rhythm No acute changes No old tracing to compare Confirmed by Nanavati, Ankit ([id number]) on 09/15/2018 5:15:47 AM   Radiology Dg Chest 2 View  Result Date: 09/15/2018 CLINICAL DATA:  Pt reports pain in her left chest around 11pm last night while resting. Pain worse to breathing in associated with some sob. No fevers, cough or recent travel. EXAM: CHEST - 2 VIEW COMPARISON:  12/28/2015 FINDINGS: Normal heart, mediastinum and hila. Clear lungs. No pleural effusion or pneumothorax. Skeletal structures are unremarkable. IMPRESSION: Normal chest radiographs. Electronically Signed   By: David  Ormond M.D.   On: 09/15/2018 05:36    Procedures Procedures (including critical care time)  Medications Ordered in ED Medications  acetaminophen (TYLENOL) tablet 1,000 mg (1,000 mg Oral Refused 09/15/18 0543)  alum & mag hydroxide-simeth (MAALOX/MYLANTA) 200-200-20 MG/5ML suspension 30 mL (30 mLs Oral Given 09/15/18 0529)    And  lidocaine (XYLOCAINE) 2 % viscous mouth solution 15 mL (15 mLs Oral Given 09/15/18 0530)     Initial Impression / Assessment and Plan / ED Course  I have reviewed the triage vital  signs and the nursing notes.  Pertinent labs & imaging results that were available during my care of the patient were reviewed by me and considered in my medical decision making (see chart for details).        32 year old female with no pertinent past medical history presenting with pleuritic chest pain, onset 2300 with shortness of breath.  No constitutional symptoms.  She is hemodynamically stable.  She is on Depakote for birth control and has a positive family history of VTE.  Since she is not PERC negative but is otherwise low risk, will check a d-dimer as well as basic labs, EKG, and chest x-ray.  Will give GI cocktail for pain control and plan to reassess.  On reevaluation, patient reports pain has resolved following GI cocktail.  EKG is normal.  Troponin is negative.  D-dimer is negative.  Labs are otherwise reassuring.  Suspect GERD.  Low suspicion for PE, ACS, esophageal rupture, tension pneumothorax, or pneumonia.  Will discharge with symptomatic treatment and education on GERD.  She can follow-up in the outpatient setting.  Return precautions given.  She is hemodynamically stable and in no acute distress.  Safe for discharge home with outpatient follow-up.  Final Clinical Impressions(s) / ED Diagnoses   Final diagnoses:  Gastroesophageal reflux disease, esophagitis presence not specified    ED Discharge Orders         Ordered    omeprazole (PRILOSEC) 20 MG capsule  Daily     09/15/18 0659    lidocaine (XYLOCAINE) 2 % solution  Every  3 hours PRN     04 /05/20 0659           Frederik Pear A, PA-C 09/15/18 [id number]    Derwood Kaplan, MD 09/15/18 2951

## 2018-09-15 NOTE — ED Notes (Signed)
Patient transported to X-ray 

## 2018-09-15 NOTE — ED Notes (Signed)
Pt given and verbalized understanding of d/c instructions and need for follow up with pcp. Told to return if s/s worsen. No further distress or questions upon ambulating out of department.

## 2018-09-15 NOTE — ED Notes (Signed)
Pt resting. Reports relief from discomfort after GI cocktail. NAD

## 2018-09-15 NOTE — ED Triage Notes (Signed)
Pt reports pain in her left chest around 11pm last night while resting. Pain worse to breathing in associated with some sob. No fevers, cough or recent travel.

## 2018-12-28 IMAGING — CT CT ABD-PELV W/ CM
2 of 4 series · 16 of 46 positions shown, 18 images · IV contrast (ISOVUE)
Comparison: None.

CLINICAL DATA: Left-sided abdominal pain with nausea and vomiting
that began last night.

EXAM:
CT ABDOMEN AND PELVIS WITH CONTRAST
TECHNIQUE: Multidetector CT imaging of the abdomen and pelvis was performed
using the standard protocol following bolus administration of
intravenous contrast.
CONTRAST:  100mL [id number] IOPAMIDOL ([id number]) INJECTION 61%

[Series 2: axial st · axial · 0.76mm/px · z∈[-516,-100]mm · 13 of 95 slices shown, 15 images]
[im 6/95  soft-tissue]
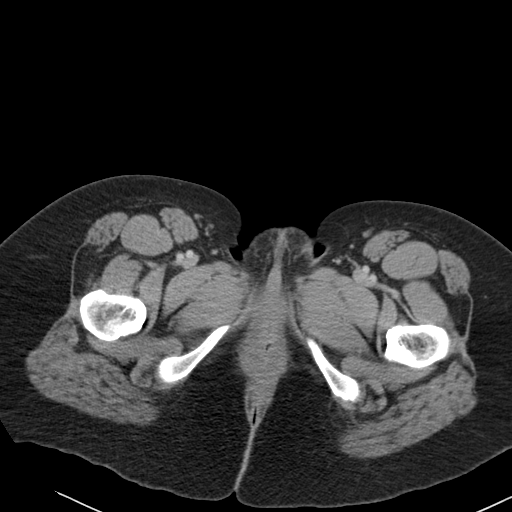
[im 6/95  bone]
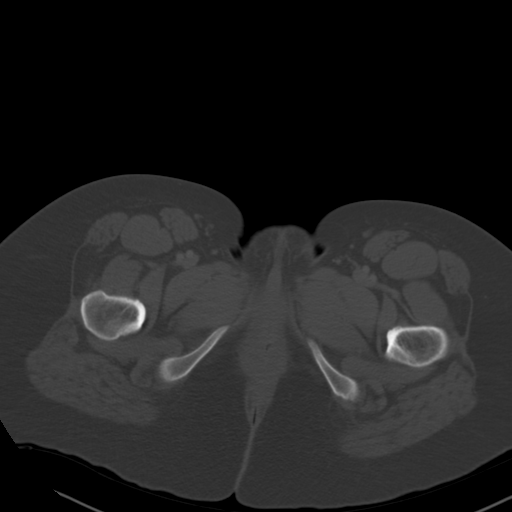
[im 11/95  soft-tissue]
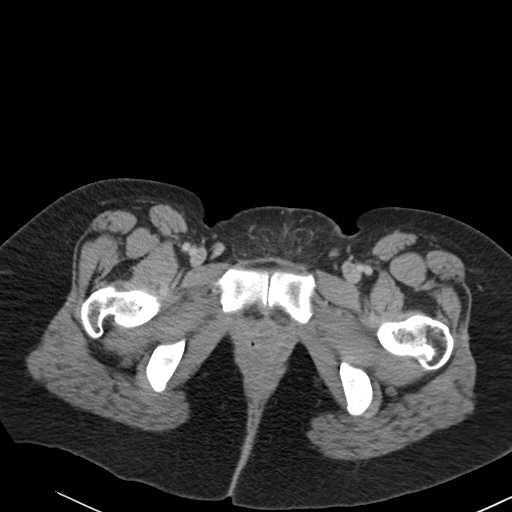
[im 21/95  soft-tissue]
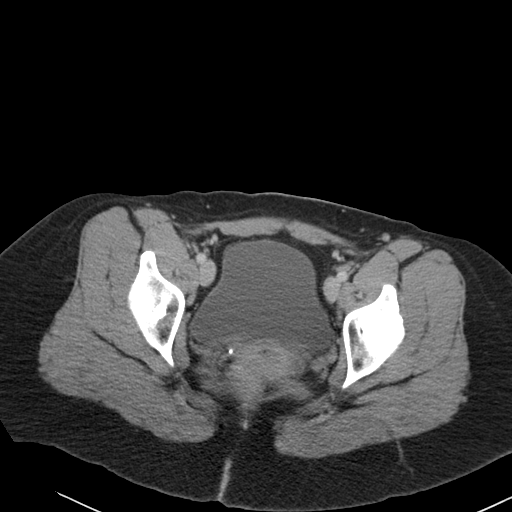
[im 27/95  soft-tissue]
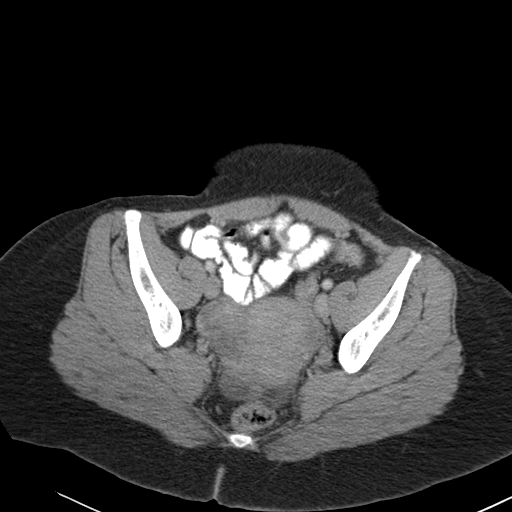
[im 32/95  soft-tissue]
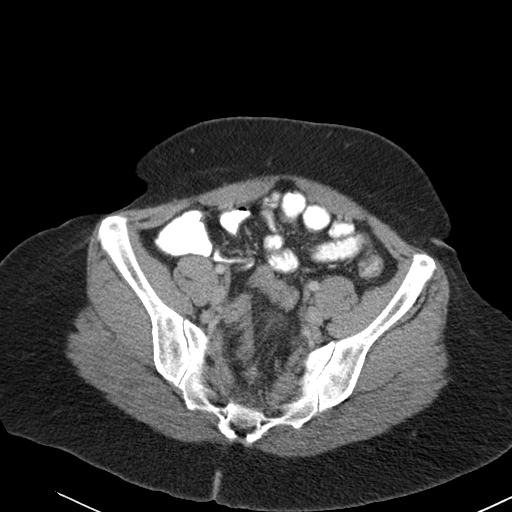
[im 42/95  soft-tissue]
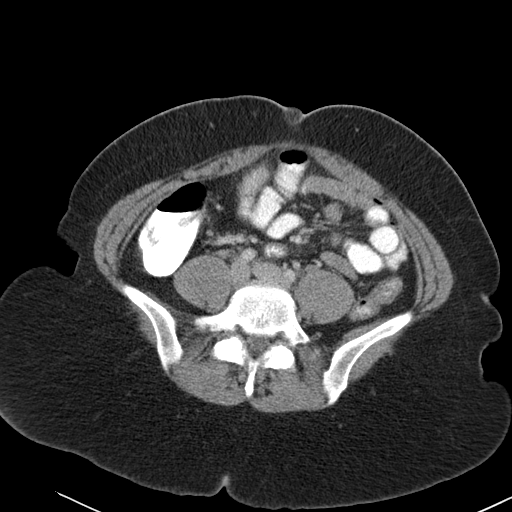
[im 48/95  soft-tissue]
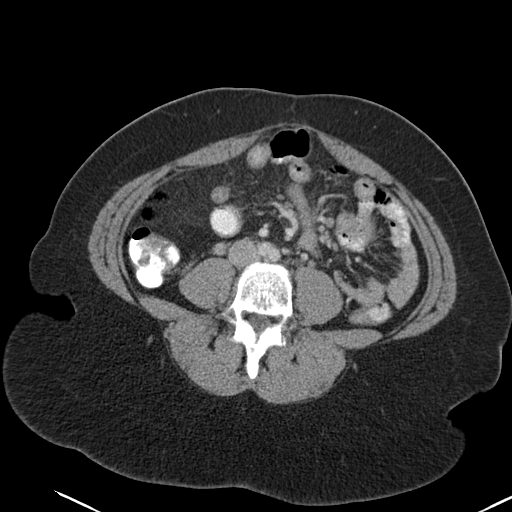
[im 53/95  soft-tissue]
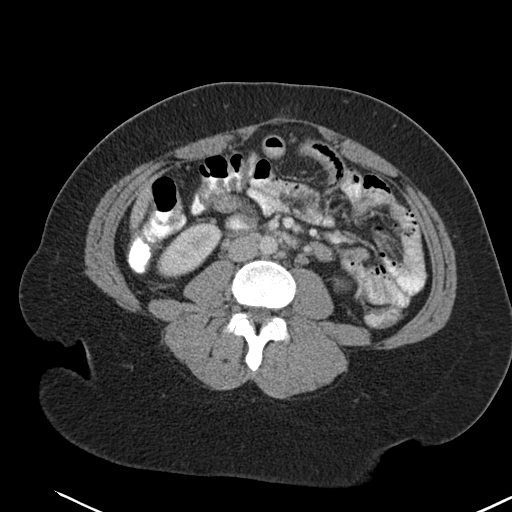
[im 63/95  soft-tissue]
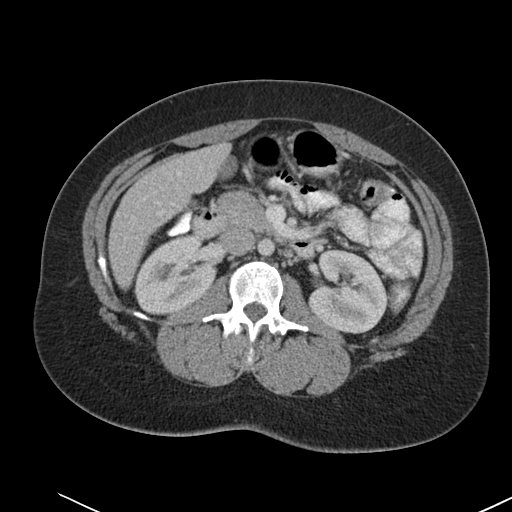
[im 63/95  bone]
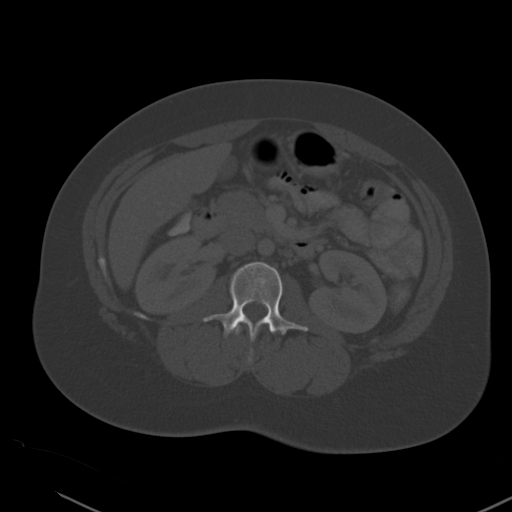
[im 68/95  soft-tissue]
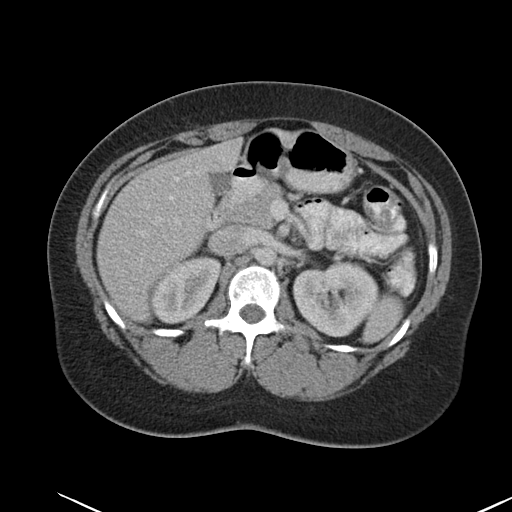
[im 74/95  soft-tissue]
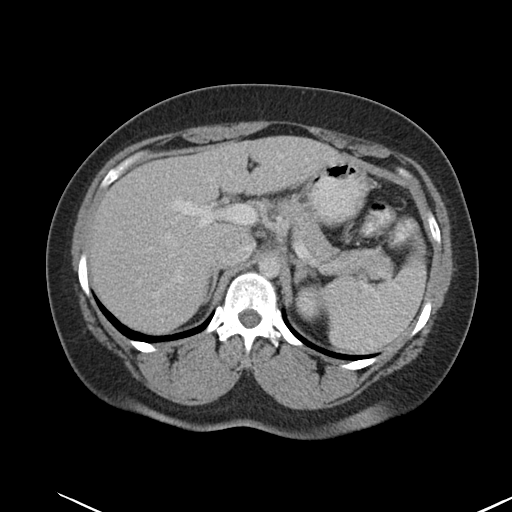
[im 84/95  soft-tissue]
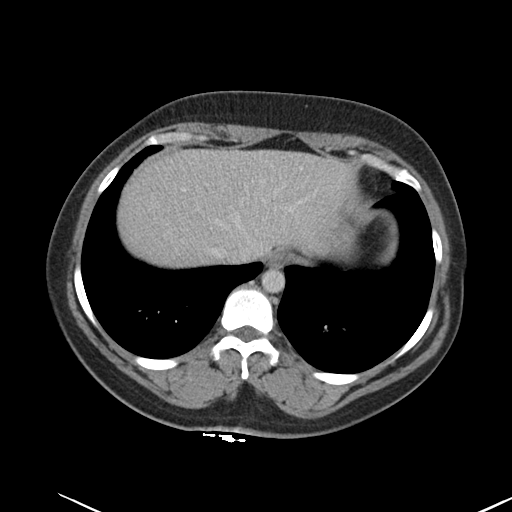
[im 89/95  soft-tissue]
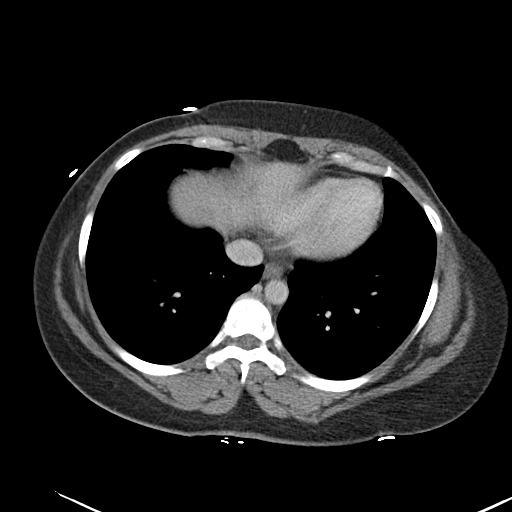

[Series 4: coronal st · coronal · 0.73mm/px · 3 of 107 slices shown]
[im 36/107  soft-tissue]
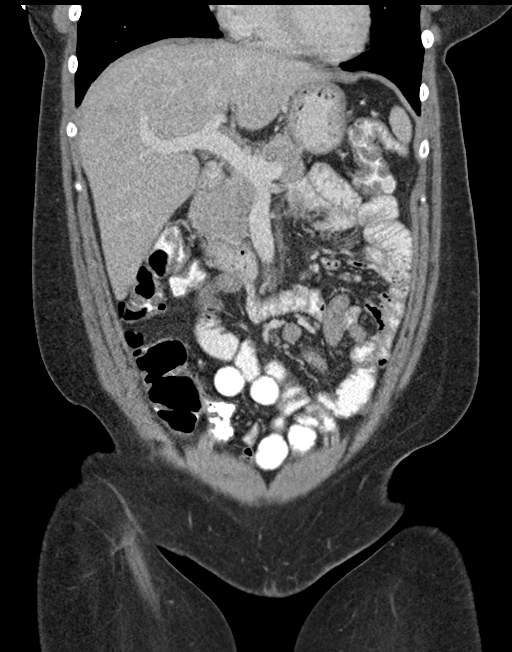
[im 48/107  soft-tissue]
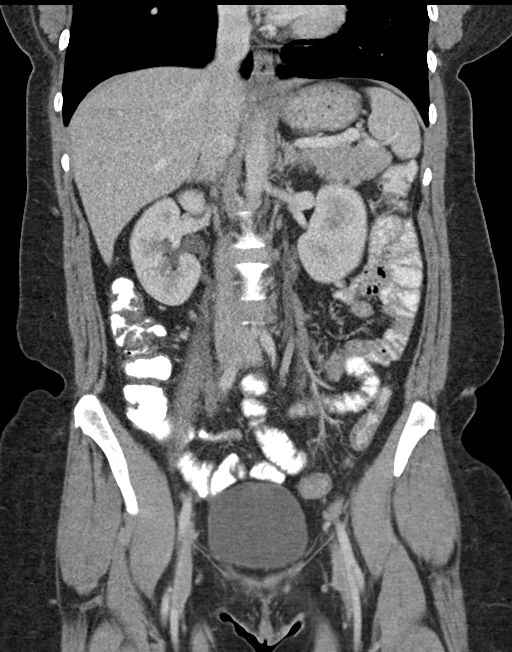
[im 59/107  soft-tissue]
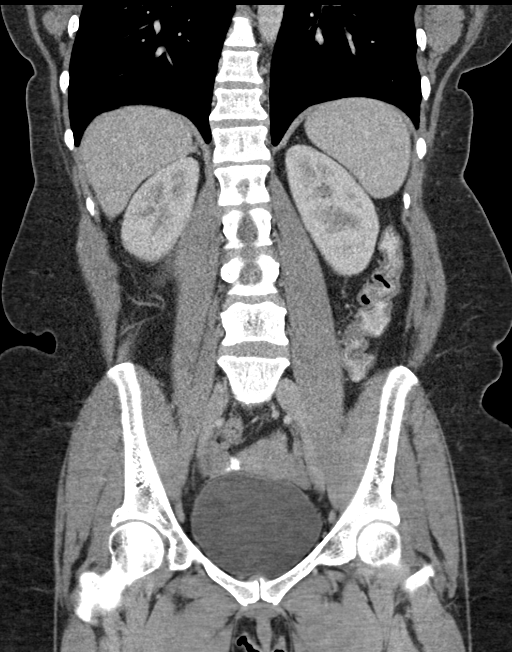

[16 of 46 positions shown; findings below may reference images not displayed]

FINDINGS: Lower chest: No acute abnormality.

Hepatobiliary: No focal liver abnormality is seen. No gallstones,
gallbladder wall thickening, or biliary dilatation.

Pancreas: Unremarkable. No pancreatic ductal dilatation or
surrounding inflammatory changes.

Spleen: Normal in size without focal abnormality.

Adrenals/Urinary Tract: Adrenal glands are unremarkable. Kidneys are
normal, without renal calculi, focal lesion, or hydronephrosis.
Bladder is unremarkable.

Stomach/Bowel: Stomach is within normal limits. Appendix appears
normal. No evidence of bowel wall thickening, distention, or
inflammatory changes.

Vascular/Lymphatic: No significant vascular findings are present. No
enlarged abdominal or pelvic lymph nodes.

Reproductive: Uterus and bilateral adnexa are unremarkable.

Other: Trace free fluid in the pelvis is likely physiologic. No
pneumoperitoneum. Small fat containing umbilical hernia.

Musculoskeletal: No acute or significant osseous findings.
IMPRESSION: 1.  No acute intra-abdominal process.

## 2019-12-01 IMAGING — CR CHEST - 2 VIEW
2 series · 2 of 2 positions shown · non-contrast
Comparison: 12/28/2015

CLINICAL DATA: Pt reports pain in her left chest around 11pm last
night while resting. Pain worse to breathing in associated with some
sob. No fevers, cough or recent travel.

EXAM:
CHEST - 2 VIEW

[w chest pa]
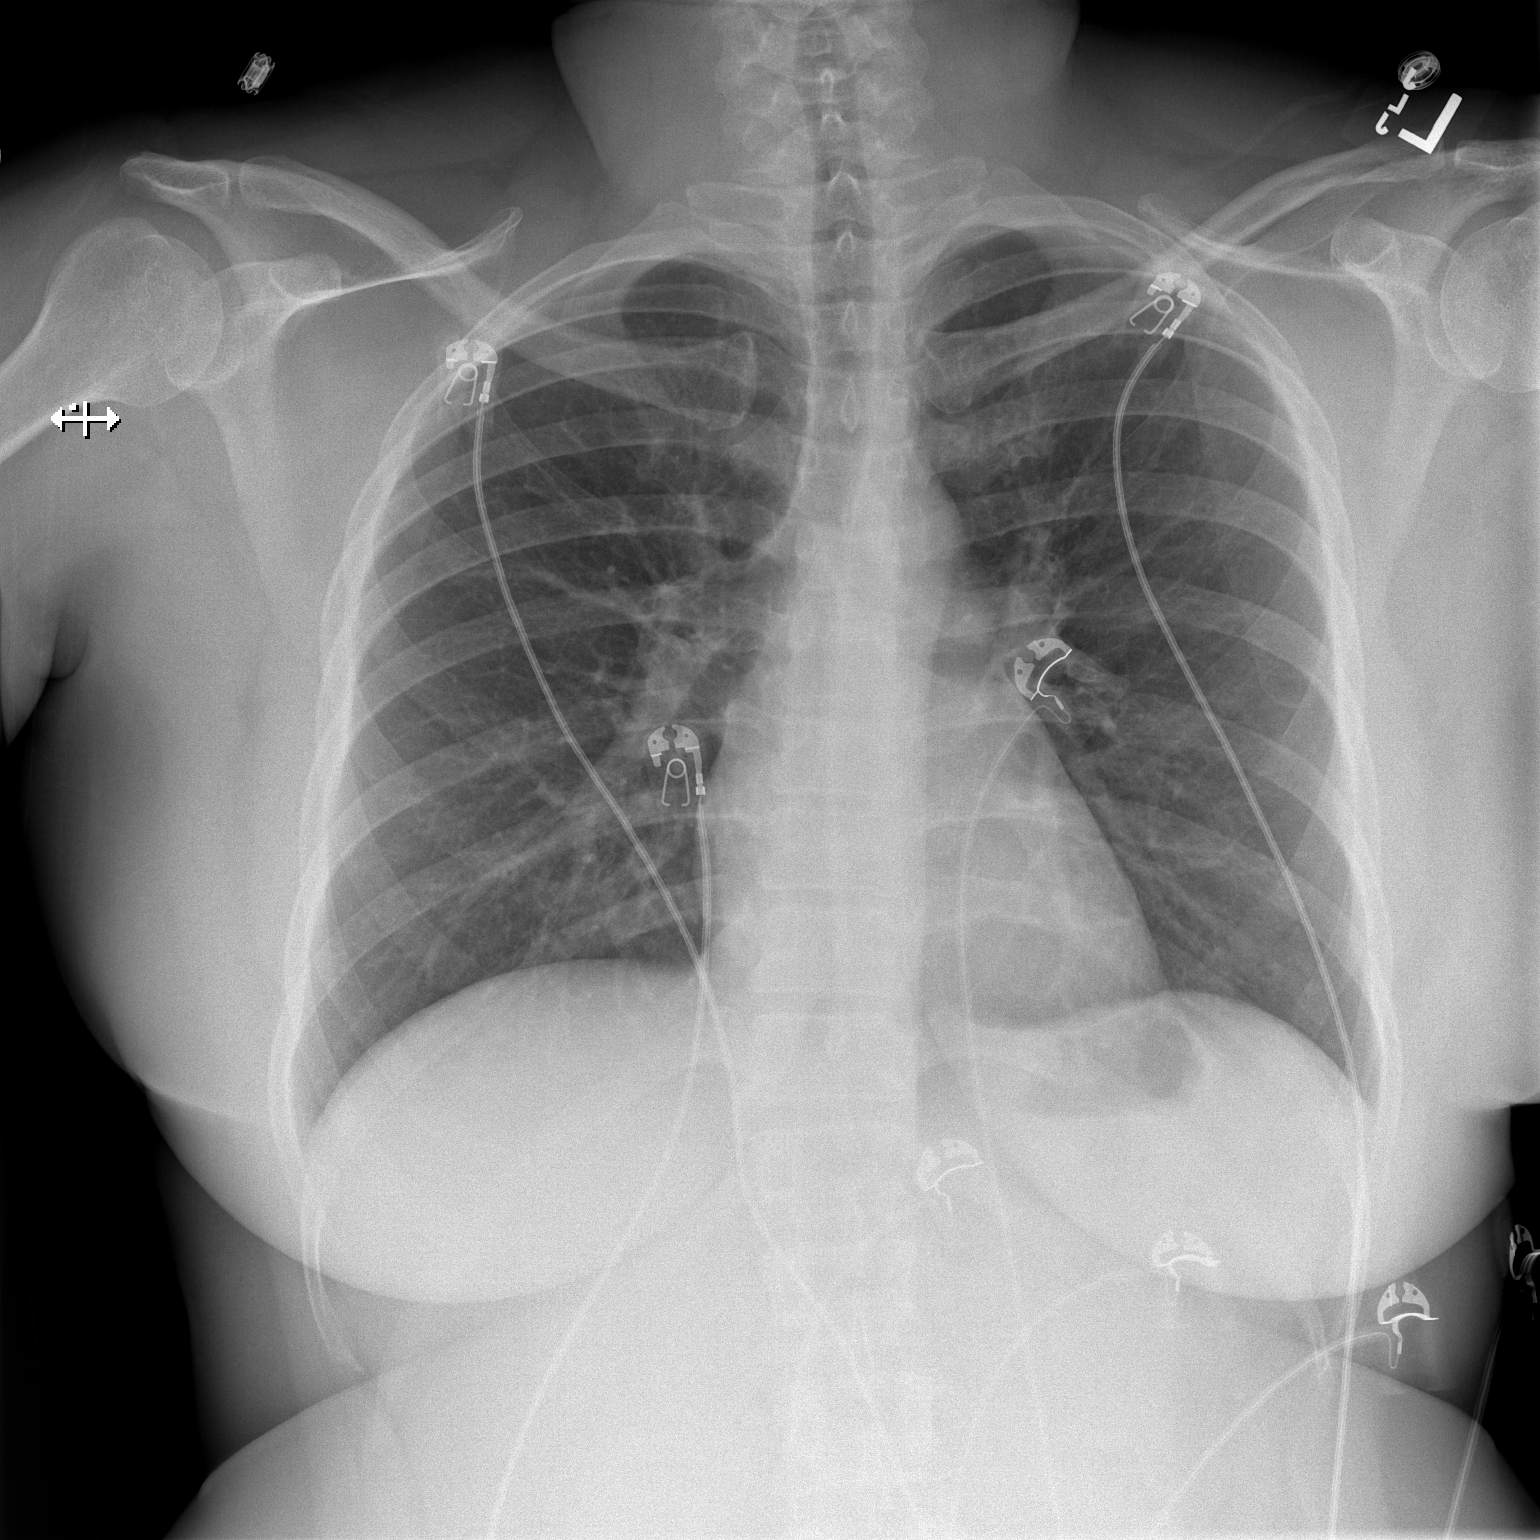

[w chest lat]
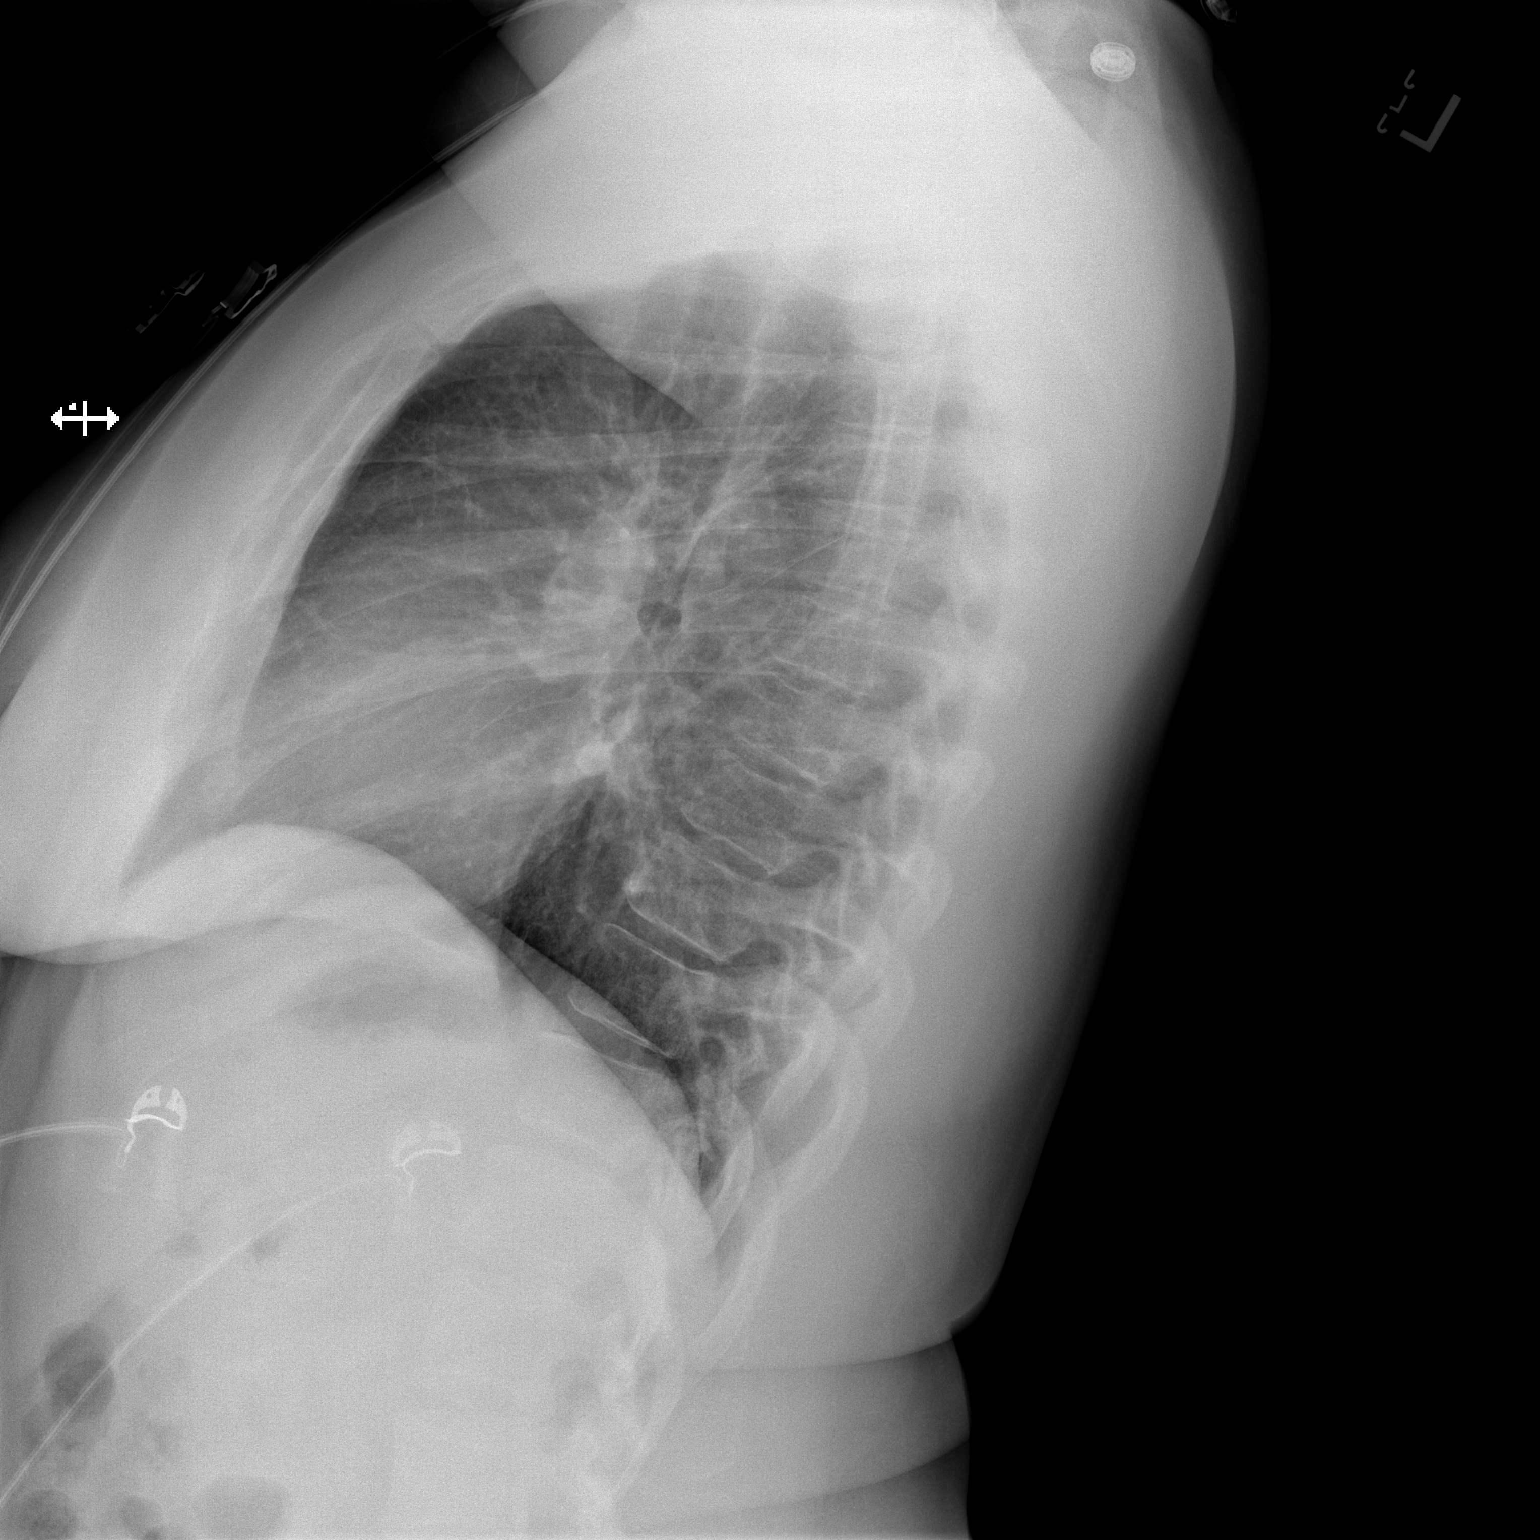

[2 of 2 positions shown; findings below may reference images not displayed]

FINDINGS: Normal heart, mediastinum and hila.

Clear lungs.

No pleural effusion or pneumothorax.

Skeletal structures are unremarkable.
IMPRESSION: Normal chest radiographs.
